# Patient Record
Sex: Female | Born: 1965 | Race: White | Hispanic: No | Marital: Married | Smoking: Current some day smoker
Health system: Southern US, Community
[De-identification: ages and names within clinical notes are randomized; demographics above are authoritative.]

## PROBLEM LIST (undated history)

## (undated) DIAGNOSIS — J45909 Unspecified asthma, uncomplicated: Secondary | ICD-10-CM

## (undated) DIAGNOSIS — F419 Anxiety disorder, unspecified: Secondary | ICD-10-CM

## (undated) HISTORY — DX: Unspecified asthma, uncomplicated: J45.909

## (undated) HISTORY — DX: Anxiety disorder, unspecified: F41.9

## (undated) HISTORY — PX: ABDOMINOPLASTY: SUR9

## (undated) HISTORY — PX: AUGMENTATION MAMMAPLASTY: SUR837

## (undated) HISTORY — PX: PLACEMENT OF BREAST IMPLANTS: SHX6334

## (undated) HISTORY — PX: TUBAL LIGATION: SHX77

---

## 2012-09-01 DIAGNOSIS — F17200 Nicotine dependence, unspecified, uncomplicated: Secondary | ICD-10-CM | POA: Insufficient documentation

## 2019-06-08 LAB — HM MAMMOGRAPHY

## 2021-01-28 ENCOUNTER — Ambulatory Visit (INDEPENDENT_AMBULATORY_CARE_PROVIDER_SITE_OTHER): Payer: PRIVATE HEALTH INSURANCE | Admitting: Family Medicine

## 2021-01-28 ENCOUNTER — Encounter: Payer: Self-pay | Admitting: Family Medicine

## 2021-01-28 ENCOUNTER — Other Ambulatory Visit: Payer: Self-pay

## 2021-01-28 DIAGNOSIS — F17210 Nicotine dependence, cigarettes, uncomplicated: Secondary | ICD-10-CM | POA: Diagnosis not present

## 2021-01-28 DIAGNOSIS — F17200 Nicotine dependence, unspecified, uncomplicated: Secondary | ICD-10-CM | POA: Insufficient documentation

## 2021-01-28 NOTE — Assessment & Plan Note (Signed)
Counseled on smoking cessation.  Pre-contemplative.

## 2021-01-28 NOTE — Progress Notes (Signed)
Dana Khan - 55 y.o. female MRN [id number]  Date of birth: 16-Aug-1965  Subjective Chief Complaint  Patient presents with   Establish Care    HPI Dana Khan is a 55 y.o. female here today for initial visit to establish care.  She has been in pretty good health.  She is a daily smoker.  She isn't ready to quit yet.  She is scheduled for annual exam at tomorrow's visit.   ROS:  A comprehensive ROS was completed and negative except as noted per HPI  No Known Allergies  History reviewed. No pertinent past medical history.  Past Surgical History:  Procedure Laterality Date   ABDOMINOPLASTY     PLACEMENT OF BREAST IMPLANTS     TUBAL LIGATION      Social History   Socioeconomic History   Marital status: Not on file    Spouse name: Not on file   Number of children: Not on file   Years of education: Not on file   Highest education level: Not on file  Occupational History   Not on file  Tobacco Use   Smoking status: Every Day    Packs/day: 0.50    Years: 13.00    Pack years: 6.50    Types: Cigarettes   Smokeless tobacco: Never  Vaping Use   Vaping Use: Never used  Substance and Sexual Activity   Alcohol use: Yes    Alcohol/week: 1.0 - 2.0 standard drink    Types: 1 - 2 Standard drinks or equivalent per week   Drug use: Never   Sexual activity: Yes  Other Topics Concern   Not on file  Social History Narrative   Not on file   Social Determinants of Health   Financial Resource Strain: Not on file  Food Insecurity: Not on file  Transportation Needs: Not on file  Physical Activity: Not on file  Stress: Not on file  Social Connections: Not on file    Family History  Problem Relation Age of Onset   Diabetes Brother    Stroke Maternal Aunt     Health Maintenance  Topic Date Due   COVID-19 Vaccine (1) Never done   Pneumococcal Vaccine 40-91 Years old (1 - PCV) Never done   HIV Screening  Never done   Hepatitis C Screening  Never done   PAP SMEAR-Modifier  Never  done   COLONOSCOPY (Pts 45-54yrs Insurance coverage will need to be confirmed)  Never done   MAMMOGRAM  Never done   Zoster Vaccines- Shingrix (1 of 2) Never done   INFLUENZA VACCINE  03/04/2021   TETANUS/TDAP  12/07/2026   HPV VACCINES  Aged Out     ----------------------------------------------------------------------------------------------------------------------------------------------------------------------------------------------------------------- Physical Exam BP (!) 163/88 (BP Location: Left Arm, Patient Position: Sitting, Cuff Size: Small)   Pulse 92   Temp 98.4 F (36.9 C)   Ht 5' 3.78" (1.62 m)   Wt 123 lb 11.2 oz (56.1 kg)   SpO2 98%   BMI 21.38 kg/m   Physical Exam Constitutional:      Appearance: Normal appearance.  Skin:    General: Skin is warm and dry.  Neurological:     General: No focal deficit present.     Mental Status: She is alert.  Psychiatric:        Mood and Affect: Mood normal.        Behavior: Behavior normal.    ------------------------------------------------------------------------------------------------------------------------------------------------------------------------------------------------------------------- Assessment and Plan  Nicotine dependence Counseled on smoking cessation.  Pre-contemplative.     No  orders of the defined types were placed in this encounter.   No follow-ups on file.    This visit occurred during the SARS-CoV-2 public health emergency.  Safety protocols were in place, including screening questions prior to the visit, additional usage of staff PPE, and extensive cleaning of exam room while observing appropriate contact time as indicated for disinfecting solutions.

## 2021-01-29 ENCOUNTER — Encounter: Payer: Self-pay | Admitting: Family Medicine

## 2021-01-29 ENCOUNTER — Ambulatory Visit (INDEPENDENT_AMBULATORY_CARE_PROVIDER_SITE_OTHER): Payer: PRIVATE HEALTH INSURANCE | Admitting: Family Medicine

## 2021-01-29 VITALS — BP 143/88 | HR 94 | Ht 63.78 in | Wt 123.0 lb

## 2021-01-29 DIAGNOSIS — Z1231 Encounter for screening mammogram for malignant neoplasm of breast: Secondary | ICD-10-CM | POA: Diagnosis not present

## 2021-01-29 DIAGNOSIS — R030 Elevated blood-pressure reading, without diagnosis of hypertension: Secondary | ICD-10-CM | POA: Diagnosis not present

## 2021-01-29 DIAGNOSIS — Z13220 Encounter for screening for lipoid disorders: Secondary | ICD-10-CM

## 2021-01-29 DIAGNOSIS — Z0000 Encounter for general adult medical examination without abnormal findings: Secondary | ICD-10-CM

## 2021-01-29 NOTE — Patient Instructions (Signed)
Preventive Care 68-54 Years Old, Female Preventive care refers to lifestyle choices and visits with your health care provider that can promote health and wellness. This includes: A yearly physical exam. This is also called an annual wellness visit. Regular dental and eye exams. Immunizations. Screening for certain conditions. Healthy lifestyle choices, such as: Eating a healthy diet. Getting regular exercise. Not using drugs or products that contain nicotine and tobacco. Limiting alcohol use. What can I expect for my preventive care visit? Physical exam Your health care provider will check your: Height and weight. These may be used to calculate your BMI (body mass index). BMI is a measurement that tells if you are at a healthy weight. Heart rate and blood pressure. Body temperature. Skin for abnormal spots. Counseling Your health care provider may ask you questions about your: Past medical problems. Family's medical history. Alcohol, tobacco, and drug use. Emotional well-being. Home life and relationship well-being. Sexual activity. Diet, exercise, and sleep habits. Work and work Statistician. Access to firearms. Method of birth control. Menstrual cycle. Pregnancy history. What immunizations do I need?  Vaccines are usually given at various ages, according to a schedule. Your health care provider will recommend vaccines for you based on your age, medicalhistory, and lifestyle or other factors, such as travel or where you work. What tests do I need? Blood tests Lipid and cholesterol levels. These may be checked every 5 years, or more often if you are over 37 years old. Hepatitis C test. Hepatitis B test. Screening Lung cancer screening. You may have this screening every year starting at age 30 if you have a 30-pack-year history of smoking and currently smoke or have quit within the past 15 years. Colorectal cancer screening. All adults should have this screening starting at  age 23 and continuing until age 3. Your health care provider may recommend screening at age 88 if you are at increased risk. You will have tests every 1-10 years, depending on your results and the type of screening test. Diabetes screening. This is done by checking your blood sugar (glucose) after you have not eaten for a while (fasting). You may have this done every 1-3 years. Mammogram. This may be done every 1-2 years. Talk with your health care provider about when you should start having regular mammograms. This may depend on whether you have a family history of breast cancer. BRCA-related cancer screening. This may be done if you have a family history of breast, ovarian, tubal, or peritoneal cancers. Pelvic exam and Pap test. This may be done every 3 years starting at age 79. Starting at age 54, this may be done every 5 years if you have a Pap test in combination with an HPV test. Other tests STD (sexually transmitted disease) testing, if you are at risk. Bone density scan. This is done to screen for osteoporosis. You may have this scan if you are at high risk for osteoporosis. Talk with your health care provider about your test results, treatment options,and if necessary, the need for more tests. Follow these instructions at home: Eating and drinking  Eat a diet that includes fresh fruits and vegetables, whole grains, lean protein, and low-fat dairy products. Take vitamin and mineral supplements as recommended by your health care provider. Do not drink alcohol if: Your health care provider tells you not to drink. You are pregnant, may be pregnant, or are planning to become pregnant. If you drink alcohol: Limit how much you have to 0-1 drink a day. Be aware  of how much alcohol is in your drink. In the [place] one drink equals one 12 oz bottle of beer (355 mL), one 5 oz glass of wine (148 mL), or one 1 oz glass of hard liquor (44 mL).  Lifestyle Take daily care of your teeth and  gums. Brush your teeth every morning and night with fluoride toothpaste. Floss one time each day. Stay active. Exercise for at least 30 minutes 5 or more days each week. Do not use any products that contain nicotine or tobacco, such as cigarettes, e-cigarettes, and chewing tobacco. If you need help quitting, ask your health care provider. Do not use drugs. If you are sexually active, practice safe sex. Use a condom or other form of protection to prevent STIs (sexually transmitted infections). If you do not wish to become pregnant, use a form of birth control. If you plan to become pregnant, see your health care provider for a prepregnancy visit. If told by your health care provider, take low-dose aspirin daily starting at age 29. Find healthy ways to cope with stress, such as: Meditation, yoga, or listening to music. Journaling. Talking to a trusted person. Spending time with friends and family. Safety Always wear your seat belt while driving or riding in a vehicle. Do not drive: If you have been drinking alcohol. Do not ride with someone who has been drinking. When you are tired or distracted. While texting. Wear a helmet and other protective equipment during sports activities. If you have firearms in your house, make sure you follow all gun safety procedures. What's next? Visit your health care provider once a year for an annual wellness visit. Ask your health care provider how often you should have your eyes and teeth checked. Stay up to date on all vaccines. This information is not intended to replace advice given to you by your health care provider. Make sure you discuss any questions you have with your healthcare provider. Document Revised: 04/24/2020 Document Reviewed: 04/01/2018 Elsevier Patient Education  2022 [employer].

## 2021-01-29 NOTE — Progress Notes (Signed)
[name] - 55 y.o. female MRN [id number]  Date of birth: June 14, 1966  Subjective Chief Complaint  Patient presents with   Annual Exam    HPI Dana Khan is a 55 y.o. female here today for annual exam.  She has been in good health.  She is a current smoker.  BP elevated, admits to some stressors related to family.  She has no concerns today.   She reports that she had normal colonoscopy at age 55 with digestive health.  Pap completed in 2020 with [facility]  She is due for mammogram.   She does not want shingles vaccine.  She does not plan to get COVID vaccine  Review of Systems  Constitutional:  Negative for chills, fever, malaise/fatigue and weight loss.  HENT:  Negative for congestion, ear pain and sore throat.   Eyes:  Negative for blurred vision, double vision and pain.  Respiratory:  Negative for cough and shortness of breath.   Cardiovascular:  Negative for chest pain and palpitations.  Gastrointestinal:  Negative for abdominal pain, blood in stool, constipation, heartburn and nausea.  Genitourinary:  Negative for dysuria and urgency.  Musculoskeletal:  Negative for joint pain and myalgias.  Neurological:  Negative for dizziness and headaches.  Endo/Heme/Allergies:  Does not bruise/bleed easily.  Psychiatric/Behavioral:  Negative for depression. The patient is not nervous/anxious and does not have insomnia.     No Known Allergies  History reviewed. No pertinent past medical history.  Past Surgical History:  Procedure Laterality Date   ABDOMINOPLASTY     PLACEMENT OF BREAST IMPLANTS     TUBAL LIGATION      Social History   Socioeconomic History   Marital status: Married    Spouse name: Not on file   Number of children: Not on file   Years of education: Not on file   Highest education level: Not on file  Occupational History   Not on file  Tobacco Use   Smoking status: Every Day    Packs/day: 0.50    Years: 13.00    Pack years: 6.50    Types:  Cigarettes   Smokeless tobacco: Never  Vaping Use   Vaping Use: Never used  Substance and Sexual Activity   Alcohol use: Yes    Alcohol/week: 1.0 - 2.0 standard drink    Types: 1 - 2 Standard drinks or equivalent per week   Drug use: Never   Sexual activity: Yes  Other Topics Concern   Not on file  Social History Narrative   Not on file   Social Determinants of Health   Financial Resource Strain: Not on file  Food Insecurity: Not on file  Transportation Needs: Not on file  Physical Activity: Not on file  Stress: Not on file  Social Connections: Not on file    Family History  Problem Relation Age of Onset   Diabetes Brother    Stroke Maternal Aunt     Health Maintenance  Topic Date Due   COVID-19 Vaccine (1) Never done   Pneumococcal Vaccine 34-80 Years old (1 - PCV) Never done   HIV Screening  Never done   Hepatitis C Screening  Never done   PAP SMEAR-Modifier  Never done   COLONOSCOPY (Pts 45-61yr Insurance coverage will need to be confirmed)  Never done   MAMMOGRAM  Never done   Zoster Vaccines- Shingrix (1 of 2) Never done   INFLUENZA VACCINE  03/04/2021   TETANUS/TDAP  12/07/2026   HPV VACCINES  Aged  Out     ----------------------------------------------------------------------------------------------------------------------------------------------------------------------------------------------------------------- Physical Exam BP (!) 143/88 (BP Location: Left Arm, Patient Position: Sitting, Cuff Size: Small)   Pulse 94   Ht 5' 3.78" (1.62 m)   Wt 123 lb (55.8 kg)   SpO2 96%   BMI 21.26 kg/m   Physical Exam Constitutional:      General: She is not in acute distress. HENT:     Head: Normocephalic and atraumatic.     Right Ear: Tympanic membrane and ear canal normal.     Left Ear: Tympanic membrane and ear canal normal.     Nose: Nose normal.  Eyes:     General: No scleral icterus.    Conjunctiva/sclera: Conjunctivae normal.  Neck:     Thyroid:  No thyromegaly.  Cardiovascular:     Rate and Rhythm: Normal rate and regular rhythm.     Heart sounds: Normal heart sounds.  Pulmonary:     Effort: Pulmonary effort is normal.     Breath sounds: Normal breath sounds.  Abdominal:     General: Bowel sounds are normal. There is no distension.     Palpations: Abdomen is soft.     Tenderness: There is no abdominal tenderness. There is no guarding.  Musculoskeletal:        General: Normal range of motion.     Cervical back: Normal range of motion and neck supple.  Lymphadenopathy:     Cervical: No cervical adenopathy.  Skin:    General: Skin is warm and dry.     Findings: No rash.  Neurological:     General: No focal deficit present.     Mental Status: She is alert and oriented to person, place, and time.     Cranial Nerves: No cranial nerve deficit.     Coordination: Coordination normal.  Psychiatric:        Mood and Affect: Mood normal.        Behavior: Behavior normal.    ------------------------------------------------------------------------------------------------------------------------------------------------------------------------------------------------------------------- Assessment and Plan  Well adult exam Well adult Orders Placed This Encounter  Procedures   MM 3D SCREEN BREAST BILATERAL    Standing Status:   Future    Standing Expiration Date:   01/29/2022    Order Specific Question:   Reason for Exam (SYMPTOM  OR DIAGNOSIS REQUIRED)    Answer:   breast cancer screening    Order Specific Question:   Is the patient pregnant?    Answer:   No    Order Specific Question:   Preferred imaging location?    Answer:   [facility]   Comp Met (CMET)   CBC with Differential   Lipid Panel w/reflex Direct LDL   TSH  Screening:  Lipid panel, mammogram.  Colonoscopy and pap reports requested. Immunizations: Declines recommended immunizations.  Anticipatory guidance/Risk factor reduction:  Counseled on smoking  cessation.  Additional recommendations per AVS   No orders of the defined types were placed in this encounter.   No follow-ups on file.    This visit occurred during the SARS-CoV-2 public health emergency.  Safety protocols were in place, including screening questions prior to the visit, additional usage of staff PPE, and extensive cleaning of exam room while observing appropriate contact time as indicated for disinfecting solutions.

## 2021-01-29 NOTE — Assessment & Plan Note (Signed)
Well adult Orders Placed This Encounter  Procedures  . MM 3D SCREEN BREAST BILATERAL    Standing Status:   Future    Standing Expiration Date:   01/29/2022    Order Specific Question:   Reason for Exam (SYMPTOM  OR DIAGNOSIS REQUIRED)    Answer:   breast cancer screening    Order Specific Question:   Is the patient pregnant?    Answer:   No    Order Specific Question:   Preferred imaging location?    Answer:   [facility]  . Comp Met (CMET)  . CBC with Differential  . Lipid Panel w/reflex Direct LDL  . TSH  Screening:  Lipid panel, mammogram.  Colonoscopy and pap reports requested. Immunizations: Declines recommended immunizations.  Anticipatory guidance/Risk factor reduction:  Counseled on smoking cessation.  Additional recommendations per AVS 

## 2021-01-30 LAB — CBC WITH DIFFERENTIAL/PLATELET
Absolute Monocytes: 460 cells/uL (ref 200–950)
Basophils Absolute: 30 cells/uL (ref 0–200)
Basophils Relative: 0.6 %
Eosinophils Absolute: 50 cells/uL (ref 15–500)
Eosinophils Relative: 1 %
HCT: 45.1 % — ABNORMAL HIGH (ref 35.0–45.0)
Hemoglobin: 15.2 g/dL (ref 11.7–15.5)
Lymphs Abs: 2180 cells/uL (ref 850–3900)
MCH: 32.1 pg (ref 27.0–33.0)
MCHC: 33.7 g/dL (ref 32.0–36.0)
MCV: 95.3 fL (ref 80.0–100.0)
MPV: 10.8 fL (ref 7.5–12.5)
Monocytes Relative: 9.2 %
Neutro Abs: 2280 cells/uL (ref 1500–7800)
Neutrophils Relative %: 45.6 %
Platelets: 231 10*3/uL (ref 140–400)
RBC: 4.73 10*6/uL (ref 3.80–5.10)
RDW: 12.4 % (ref 11.0–15.0)
Total Lymphocyte: 43.6 %
WBC: 5 10*3/uL (ref 3.8–10.8)

## 2021-01-30 LAB — LIPID PANEL W/REFLEX DIRECT LDL
Cholesterol: 213 mg/dL — ABNORMAL HIGH (ref <200)
HDL: 80 mg/dL (ref >=50)
LDL Cholesterol (Calc): 117 mg/dL (calc) — ABNORMAL HIGH
Non-HDL Cholesterol (Calc): 133 mg/dL (calc) — ABNORMAL HIGH (ref <130)
Total CHOL/HDL Ratio: 2.7 (calc) (ref <5.0)
Triglycerides: 64 mg/dL (ref <150)

## 2021-01-30 LAB — COMPREHENSIVE METABOLIC PANEL
AG Ratio: 1.8 (calc) (ref 1.0–2.5)
ALT: 19 U/L (ref 6–29)
AST: 18 U/L (ref 10–35)
Albumin: 4.9 g/dL (ref 3.6–5.1)
Alkaline phosphatase (APISO): 59 U/L (ref 37–153)
BUN: 9 mg/dL (ref 7–25)
CO2: 33 mmol/L — ABNORMAL HIGH (ref 20–32)
Calcium: 10.4 mg/dL (ref 8.6–10.4)
Chloride: 97 mmol/L — ABNORMAL LOW (ref 98–110)
Creat: 0.6 mg/dL (ref 0.50–1.05)
Globulin: 2.8 g/dL (calc) (ref 1.9–3.7)
Glucose, Bld: 90 mg/dL (ref 65–99)
Potassium: 4.2 mmol/L (ref 3.5–5.3)
Sodium: 138 mmol/L (ref 135–146)
Total Bilirubin: 0.3 mg/dL (ref 0.2–1.2)
Total Protein: 7.7 g/dL (ref 6.1–8.1)

## 2021-01-30 LAB — TSH: TSH: 2.11 mIU/L

## 2021-02-13 ENCOUNTER — Other Ambulatory Visit: Payer: Self-pay | Admitting: Family Medicine

## 2021-02-13 ENCOUNTER — Ambulatory Visit (INDEPENDENT_AMBULATORY_CARE_PROVIDER_SITE_OTHER): Payer: PRIVATE HEALTH INSURANCE

## 2021-02-13 ENCOUNTER — Other Ambulatory Visit: Payer: Self-pay

## 2021-02-13 DIAGNOSIS — Z1231 Encounter for screening mammogram for malignant neoplasm of breast: Secondary | ICD-10-CM

## 2021-05-17 ENCOUNTER — Emergency Department (INDEPENDENT_AMBULATORY_CARE_PROVIDER_SITE_OTHER): Payer: PRIVATE HEALTH INSURANCE

## 2021-05-17 ENCOUNTER — Emergency Department
Admission: EM | Admit: 2021-05-17 | Discharge: 2021-05-17 | Disposition: A | Discharge status: Home / Self Care | Payer: PRIVATE HEALTH INSURANCE | Source: Home / Self Care | Attending: Family Medicine | Admitting: Family Medicine

## 2021-05-17 ENCOUNTER — Other Ambulatory Visit: Payer: Self-pay

## 2021-05-17 DIAGNOSIS — R0789 Other chest pain: Secondary | ICD-10-CM | POA: Diagnosis not present

## 2021-05-17 DIAGNOSIS — R079 Chest pain, unspecified: Secondary | ICD-10-CM | POA: Diagnosis not present

## 2021-05-17 NOTE — ED Provider Notes (Signed)
[facility]    CSN: [id number] Arrival date & time: 05/17/21  1451      History   Chief Complaint Chief Complaint  Patient presents with   Shoulder Injury   Chest Pain    HPI Dana Khan is a 55 y.o. female.   Patient states that she has been having intermittent vague left shoulder pain for about 2 weeks.  She recalls no injury or change in activities.  She states that she has had an intermittent non-productive cough for about two months.  She continues to smoke.  During the past week she has had vague intermittent recurring left and mid-chest non-radiating pain.  The pain occurs at rest and not with activity.  She denies shortness of breath, pleuritic pain, and fevers, chills, and sweats.  She denies GI symptoms.  She notes that her chest and shoulder symptoms do not seem to be related. Family history of heart failure in her mother.  The history is provided by the patient.  Chest Pain Pain location:  L chest and substernal area Pain quality: aching   Pain radiates to:  Does not radiate Pain severity:  Mild Onset quality:  Sudden Duration:  1 week Timing:  Sporadic Progression:  Unchanged Chronicity:  New Context: at rest   Context: not breathing, not eating, not lifting, not movement, not raising an arm, not stress and not trauma   Relieved by:  Nothing Worsened by:  Nothing Ineffective treatments:  None tried Associated symptoms: cough and headache   Associated symptoms: no abdominal pain, no AICD problem, no anorexia, no anxiety, no back pain, no claudication, no diaphoresis, no dizziness, no dysphagia, no fatigue, no fever, no heartburn, no lower extremity edema, no nausea, no palpitations, no PND, no shortness of breath, no syncope, no vomiting and no weakness   Risk factors: smoking   Shoulder Pain Location:  Shoulder Shoulder location:  L shoulder Injury: no   Pain details:    Quality:  Aching   Radiates to:  Does not radiate   Severity:  Mild    Onset quality:  Sudden   Duration:  2 weeks   Timing:  Sporadic   Progression:  Unchanged Handedness:  Right-handed Prior injury to area:  No Relieved by:  None tried Worsened by:  Nothing Ineffective treatments:  None tried Associated symptoms: no back pain, no decreased range of motion, no fatigue, no fever, no neck pain, no stiffness and no swelling   Risk factors: no recent illness    History reviewed. No pertinent past medical history.  Patient Active Problem List   Diagnosis Date Noted   Well adult exam 01/29/2021   Nicotine dependence 01/28/2021    Past Surgical History:  Procedure Laterality Date   ABDOMINOPLASTY     AUGMENTATION MAMMAPLASTY Bilateral    PLACEMENT OF BREAST IMPLANTS     TUBAL LIGATION      OB History   No obstetric history on file.      Home Medications    Prior to Admission medications   Not on File    Family History Family History  Problem Relation Age of Onset   Heart failure Mother    Diabetes Brother    Stroke Maternal Aunt     Social History Social History   Tobacco Use   Smoking status: Every Day    Packs/day: 0.50    Years: 13.00    Pack years: 6.50    Types: Cigarettes   Smokeless tobacco: Never  Vaping  Use   Vaping Use: Never used  Substance Use Topics   Alcohol use: Yes    Alcohol/week: 1.0 - 2.0 standard drink    Types: 1 - 2 Standard drinks or equivalent per week   Drug use: Never     Allergies   Patient has no known allergies.   Review of Systems Review of Systems  Constitutional:  Negative for activity change, appetite change, chills, diaphoresis, fatigue, fever and unexpected weight change.  HENT: Negative.  Negative for trouble swallowing.   Eyes: Negative.   Respiratory:  Positive for cough. Negative for chest tightness, shortness of breath, wheezing and stridor.   Cardiovascular:  Positive for chest pain. Negative for palpitations, claudication, leg swelling, syncope and PND.  Gastrointestinal:   Negative for abdominal pain, anorexia, heartburn, nausea and vomiting.  Genitourinary: Negative.   Musculoskeletal:  Negative for back pain, neck pain and stiffness.  Skin:  Negative for rash.  Neurological:  Positive for headaches. Negative for dizziness and weakness.    Physical Exam Triage Vital Signs ED Triage Vitals  Enc Vitals Group     BP 05/17/21 1513 124/87     Pulse Rate 05/17/21 1513 93     Resp 05/17/21 1513 15     Temp 05/17/21 1513 98.2 F (36.8 C)     Temp Source 05/17/21 1513 Oral     SpO2 05/17/21 1513 95 %     Weight 05/17/21 1509 122 lb (55.3 kg)     Height 05/17/21 1509 5\' 4"  (1.626 m)     Head Circumference --      Peak Flow --      Pain Score 05/17/21 1508 4     Pain Loc --      Pain Edu? --      Excl. in GC? --    No data found.  Updated Vital Signs BP 124/87 (BP Location: Right Arm)   Pulse 93   Temp 98.2 F (36.8 C) (Oral)   Resp 15   Ht 5\' 4"  (1.626 m)   Wt 55.3 kg   SpO2 95%   BMI 20.94 kg/m   Visual Acuity Right Eye Distance:   Left Eye Distance:   Bilateral Distance:    Right Eye Near:   Left Eye Near:    Bilateral Near:     Physical Exam Vitals and nursing note reviewed.  Constitutional:      General: She is not in acute distress. HENT:     Head: Normocephalic.     Nose: Nose normal.     Mouth/Throat:     Pharynx: Oropharynx is clear.  Eyes:     Conjunctiva/sclera: Conjunctivae normal.     Pupils: Pupils are equal, round, and reactive to light.     Comments: Fundi benign  Cardiovascular:     Rate and Rhythm: Normal rate and regular rhythm.     Heart sounds: Normal heart sounds.  Pulmonary:     Breath sounds: Normal breath sounds.  Chest:     Chest wall: No tenderness.  Abdominal:     Palpations: Abdomen is soft.     Tenderness: There is no abdominal tenderness.  Musculoskeletal:        General: No swelling or tenderness.     Left shoulder: Normal. No tenderness or bony tenderness. Normal range of motion. Normal  pulse.     Cervical back: Neck supple.     Right lower leg: No edema.     Left lower leg: No edema.  Lymphadenopathy:     Cervical: No cervical adenopathy.  Skin:    General: Skin is warm and dry.     Findings: No rash.  Neurological:     General: No focal deficit present.     Mental Status: She is alert.     UC Treatments / Results  Labs (all labs ordered are listed, but only abnormal results are displayed) Labs Reviewed - No data to display  EKG   Radiology DG Chest 2 View  Result Date: 05/17/2021 CLINICAL DATA:  Left anterosuperior intermittent chest pain for 1 week EXAM: CHEST - 2 VIEW COMPARISON:  None. FINDINGS: The heart and mediastinal contours are within normal limits. Mild atherosclerotic plaque. No focal consolidation. No pulmonary edema. No pleural effusion. No pneumothorax. No acute osseous abnormality. IMPRESSION: 1. No active cardiopulmonary disease. 2.  Aortic Atherosclerosis (ICD10-I70.0). Electronically Signed   By: Tish Frederickson M.D.   On: 05/17/2021 16:53    Procedures Procedures (including critical care time)  Medications Ordered in UC Medications - No data to display  Initial Impression / Assessment and Plan / UC Course  I have reviewed the triage vital signs and the nursing notes.  Pertinent labs & imaging results that were available during my care of the patient were reviewed by me and considered in my medical decision making (see chart for details).    Patient assymptomatic at present and exam benign.  Chest x-ray shows no pulmonary findings in a smoker.  Note chest x-ray findings of mild atherosclerotic plaque.  ?atypical angina. Followup with Family Doctor as soon as possible fur further evaluation.  Final Clinical Impressions(s) / UC Diagnoses   Final diagnoses:  Atypical chest pain     Discharge Instructions      If symptoms become significantly worse during the night or over the weekend, proceed to the local emergency room.     ED Prescriptions   None       Lattie Haw, MD 05/18/21 1102

## 2021-05-17 NOTE — ED Triage Notes (Signed)
Pt st she has been having L shoulder pain for a while but recently started having chest pain and dull headache with blurred vision.

## 2021-05-17 NOTE — Discharge Instructions (Signed)
If symptoms become significantly worse during the night or over the weekend, proceed to the local emergency room.  

## 2021-05-20 ENCOUNTER — Ambulatory Visit (INDEPENDENT_AMBULATORY_CARE_PROVIDER_SITE_OTHER): Payer: PRIVATE HEALTH INSURANCE | Admitting: Family Medicine

## 2021-05-20 ENCOUNTER — Encounter: Payer: Self-pay | Admitting: Family Medicine

## 2021-05-20 VITALS — BP 120/75 | HR 77 | Ht 64.0 in | Wt 123.0 lb

## 2021-05-20 DIAGNOSIS — M25512 Pain in left shoulder: Secondary | ICD-10-CM | POA: Diagnosis not present

## 2021-05-20 DIAGNOSIS — I700 Atherosclerosis of aorta: Secondary | ICD-10-CM

## 2021-05-20 DIAGNOSIS — R0789 Other chest pain: Secondary | ICD-10-CM

## 2021-05-20 NOTE — Progress Notes (Signed)
Established Patient Office Visit  Subjective:  Patient ID: Dana Khan, female    DOB: 1965/10/03  Age: 55 y.o. MRN: [id number]  CC:  Chief Complaint  Patient presents with   Follow-up    Chest pains    HPI [facility] presents for Chest pain and SOB intermittent. + smoker.  She says she is actually been having left-sided shoulder pain mostly over the outer shoulder for several months but starting a couple of weeks ago she started noticing some left-sided chest pain.  She says when it happens it usually last a couple minutes and then usually ease off.  She just describes it as a "pain".  She occasionally feels short of breath but is not necessarily associated with when she is having the chest pain.  She has not really exercise recently because she got custody of her grandkids and has been taking care of those and has not been able to work out like she used to so she is not sure if the chest pain gets worse or occurs more often with exercise.  She is a smoker.  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  She did have a chest x-ray in the urgent care on October 14 which showed aortic atherosclerosis but otherwise negative chest x-ray.  He reports that she has been more fatigued for several months. Hx of panic attacks. Mild hyperlipidemia.    Fam Hx: Dana Khan with CHF.    History reviewed. No pertinent past medical history.  Past Surgical History:  Procedure Laterality Date   ABDOMINOPLASTY     AUGMENTATION MAMMAPLASTY Bilateral    PLACEMENT OF BREAST IMPLANTS     TUBAL LIGATION      Family History  Problem Relation Age of Onset   Heart failure Mother    Diabetes Brother    Stroke Maternal Aunt     Social History   Socioeconomic History   Marital status: Married    Spouse name: Not on file   Number of children: Not on file   Years of education: Not on file   Highest education level: Not on file  Occupational History   Not on file  Tobacco Use   Smoking status: Every Day    Packs/day: 0.50     Years: 13.00    Pack years: 6.50    Types: Cigarettes   Smokeless tobacco: Never  Vaping Use   Vaping Use: Never used  Substance and Sexual Activity   Alcohol use: Yes    Alcohol/week: 1.0 - 2.0 standard drink    Types: 1 - 2 Standard drinks or equivalent per week   Drug use: Never   Sexual activity: Yes  Other Topics Concern   Not on file  Social History Narrative   Not on file   Social Determinants of Health   Financial Resource Strain: Not on file  Food Insecurity: Not on file  Transportation Needs: Not on file  Physical Activity: Not on file  Stress: Not on file  Social Connections: Not on file  Intimate Partner Violence: Not on file    No outpatient medications prior to visit.   No facility-administered medications prior to visit.    No Known Allergies  ROS Review of Systems    Objective:    Physical Exam Constitutional:      Appearance: Normal appearance. She is well-developed.  HENT:     Head: Normocephalic and atraumatic.  Cardiovascular:     Rate and Rhythm: Normal rate and regular rhythm.  Heart sounds: Normal heart sounds.  Pulmonary:     Effort: Pulmonary effort is normal.     Comments: Diffuse coarse breath sounds. Musculoskeletal:     Comments: Left shoulder with normal range of motion and strength is 5 out of 5.  Negative empty can test.  Normal internal and external rotation.  Nontender joint.  Skin:    General: Skin is warm and dry.  Neurological:     Mental Status: She is alert and oriented to person, place, and time.  Psychiatric:        Behavior: Behavior normal.    BP 120/75   Pulse 77   Ht 5\' 4"  (1.626 m)   Wt 123 lb (55.8 kg)   SpO2 98%   BMI 21.11 kg/m  Wt Readings from Last 3 Encounters:  05/20/21 123 lb (55.8 kg)  05/17/21 122 lb (55.3 kg)  01/29/21 123 lb (55.8 kg)     There are no preventive care reminders to display for this patient.   There are no preventive care reminders to display for this  patient.  Lab Results  Component Value Date   TSH 2.11 01/29/2021   Lab Results  Component Value Date   WBC 5.0 01/29/2021   HGB 15.2 01/29/2021   HCT 45.1 (H) 01/29/2021   MCV 95.3 01/29/2021   PLT 231 01/29/2021   Lab Results  Component Value Date   NA 138 01/29/2021   K 4.2 01/29/2021   CO2 33 (H) 01/29/2021   GLUCOSE 90 01/29/2021   BUN 9 01/29/2021   CREATININE 0.60 01/29/2021   BILITOT 0.3 01/29/2021   AST 18 01/29/2021   ALT 19 01/29/2021   PROT 7.7 01/29/2021   CALCIUM 10.4 01/29/2021   Lab Results  Component Value Date   CHOL 213 (H) 01/29/2021   Lab Results  Component Value Date   HDL 80 01/29/2021   Lab Results  Component Value Date   LDLCALC 117 (H) 01/29/2021   Lab Results  Component Value Date   TRIG 64 01/29/2021   Lab Results  Component Value Date   CHOLHDL 2.7 01/29/2021   No results found for: HGBA1C    Assessment & Plan:   Problem List Items Addressed This Visit       Cardiovascular and Mediastinum   Aortic atherosclerosis (HCC)   Relevant Orders   Exercise Tolerance Test   Cardiac Stress Test: Informed Consent Details: Physician/Practitioner Attestation; Transcribe to consent form and obtain patient signature   Other Visit Diagnoses     Atypical chest pain    -  Primary   Relevant Orders   EKG 12-Lead   Exercise Tolerance Test   Cardiac Stress Test: Informed Consent Details: Physician/Practitioner Attestation; Transcribe to consent form and obtain patient signature   Acute pain of left shoulder         Atypical chest pain with mild hyperlipidemia, smoking history, and Dana Khan with a history of MI at age 28.  Noted aortic atherosclerosis on chest x-ray.  I think we should do further work-up with a treadmill stress test she does feel like she could walk without difficulty.  Encouraged smoking cessation of course. EKG today shows rate of 77 bpm, normal sinus rhythm with no acute ST-T wave changes.  We also discussed that stress  could certainly be a component.  She has had a lot going on with taking on her 2 grandkids who are 4 and a.  And has not really had a lot of time for  herself.  She has been staying up late to get things done and so has not really been getting enough rest.  We will more than happy to discuss stress further and help her cope, but some ways to get into a new regimen with work and taking care of her grandkids  Left shoulder pain-suspect bursitis she has great range of motion and strength and no evidence of rotator cuff injury.  Given exercises to do on her own at home over the next several weeks if not improving then please let me know.  No orders of the defined types were placed in this encounter.   Follow-up: No follow-ups on file.    Nani Gasser, MD

## 2021-05-21 ENCOUNTER — Ambulatory Visit (INDEPENDENT_AMBULATORY_CARE_PROVIDER_SITE_OTHER): Payer: PRIVATE HEALTH INSURANCE

## 2021-05-21 ENCOUNTER — Ambulatory Visit: Payer: PRIVATE HEALTH INSURANCE | Admitting: Family Medicine

## 2021-05-21 ENCOUNTER — Other Ambulatory Visit: Payer: Self-pay

## 2021-05-21 DIAGNOSIS — I700 Atherosclerosis of aorta: Secondary | ICD-10-CM

## 2021-05-21 DIAGNOSIS — R0789 Other chest pain: Secondary | ICD-10-CM

## 2021-05-22 LAB — EXERCISE TOLERANCE TEST
Angina Index: 0
Estimated workload: 11.7
Exercise duration (min): 10 min
Exercise duration (sec): 0 s
MPHR: 166 {beats}/min
Peak HR: 142 {beats}/min
Percent HR: 85 %
RPE: 17
Rest HR: 94 {beats}/min

## 2021-05-23 NOTE — Progress Notes (Signed)
Hi Dana Khan, great news!  You did have a normal stress test.  Your blood pressure did go up during exercise but there were no concerning findings for ischemia.  This is great news.  I would encourage you to embark upon a regular exercise routine if you are not already doing so to really work on building up stamina and endurance.

## 2021-09-09 ENCOUNTER — Encounter: Payer: Self-pay | Admitting: Physician Assistant

## 2021-09-09 ENCOUNTER — Ambulatory Visit (INDEPENDENT_AMBULATORY_CARE_PROVIDER_SITE_OTHER): Payer: PRIVATE HEALTH INSURANCE | Admitting: Physician Assistant

## 2021-09-09 ENCOUNTER — Other Ambulatory Visit: Payer: Self-pay

## 2021-09-09 VITALS — BP 145/96 | HR 84 | Temp 98.4°F | Ht 64.0 in | Wt 125.0 lb

## 2021-09-09 DIAGNOSIS — F17200 Nicotine dependence, unspecified, uncomplicated: Secondary | ICD-10-CM

## 2021-09-09 DIAGNOSIS — J069 Acute upper respiratory infection, unspecified: Secondary | ICD-10-CM | POA: Diagnosis not present

## 2021-09-09 DIAGNOSIS — H1013 Acute atopic conjunctivitis, bilateral: Secondary | ICD-10-CM | POA: Insufficient documentation

## 2021-09-09 DIAGNOSIS — J029 Acute pharyngitis, unspecified: Secondary | ICD-10-CM

## 2021-09-09 LAB — POCT RAPID STREP A (OFFICE): Rapid Strep A Screen: NEGATIVE

## 2021-09-09 MED ORDER — AZELASTINE HCL 0.05 % OP SOLN: 2.0000 [drp] | Freq: Two times a day (BID) | OPHTHALMIC | 11 refills | Status: DC

## 2021-09-09 NOTE — Patient Instructions (Addendum)
Nasal saline washes Tylenol cold sinus severe  Upper Respiratory Infection, Adult An upper respiratory infection (URI) affects the nose, throat, and upper airways that lead to the lungs. The most common type of URI is often called the common cold. URIs usually get better on their own, without medical treatment. What are the causes? A URI is caused by a germ (virus). You may catch these germs by: Breathing in droplets from an infected person's cough or sneeze. Touching something that has the germ on it (is contaminated) and then touching your mouth, nose, or eyes. What increases the risk? You are more likely to get a URI if: You are very young or very old. You have close contact with others, such as at work, school, or a health care facility. You smoke. You have long-term (chronic) heart or lung disease. You have a weakened disease-fighting system (immune system). You have nasal allergies or asthma. You have a lot of stress. You have poor nutrition. What are the signs or symptoms? Runny or stuffy (congested) nose. Cough. Sneezing. Sore throat. Headache. Feeling tired (fatigue). Fever. Not wanting to eat as much as usual. Pain in your forehead, behind your eyes, and over your cheekbones (sinus pain). Muscle aches. Redness or irritation of the eyes. Pressure in the ears or face. How is this treated? URIs usually get better on their own within 7-10 days. Medicines cannot cure URIs, but your doctor may recommend certain medicines to help relieve symptoms, such as: Over-the-counter cold medicines. Medicines to reduce coughing (cough suppressants). Coughing is a type of defense against infection that helps to clear the nose, throat, windpipe, and lungs (respiratory system). Take these medicines only as told by your doctor. Medicines to lower your fever. Follow these instructions at home: Activity Rest as needed. If you have a fever, stay home from work or school until your fever is  gone, or until your doctor says you may return to work or school. You should stay home until you cannot spread the infection anymore (you are not contagious). Your doctor may have you wear a face mask so you have less risk of spreading the infection. Relieving symptoms Rinse your mouth often with salt water. To make salt water, dissolve -1 tsp (3-6 g) of salt in 1 cup (237 mL) of warm water. Use a cool-mist humidifier to add moisture to the air. This can help you breathe more easily. Eating and drinking  Drink enough fluid to keep your pee (urine) pale yellow. Eat soups and other clear broths. General instructions  Take over-the-counter and prescription medicines only as told by your doctor. Do not smoke or use any products that contain nicotine or tobacco. If you need help quitting, ask your doctor. Avoid being where people are smoking (avoid secondhand smoke). Stay up to date on all your shots (immunizations), and get the flu shot every year. Keep all follow-up visits. How to prevent the spread of infection to others  Wash your hands with soap and water for at least 20 seconds. If you cannot use soap and water, use hand sanitizer. Avoid touching your mouth, face, eyes, or nose. Cough or sneeze into a tissue or your sleeve or elbow. Do not cough or sneeze into your hand or into the air. Contact a doctor if: You are getting worse, not better. You have any of these: A fever or chills. Brown or red mucus in your nose. Yellow or brown fluid (discharge)coming from your nose. Pain in your face, especially when you bend  forward. Swollen neck glands. Pain when you swallow. White areas in the back of your throat. Get help right away if: You have shortness of breath that gets worse. You have very bad or constant: Headache. Ear pain. Pain in your forehead, behind your eyes, and over your cheekbones (sinus pain). Chest pain. You have long-lasting (chronic) lung disease along with any of  these: Making high-pitched whistling sounds when you breathe, most often when you breathe out (wheezing). Long-lasting cough (more than 14 days). Coughing up blood. A change in your usual mucus. You have a stiff neck. You have changes in your: Vision. Hearing. Thinking. Mood. These symptoms may be an emergency. Get help right away. Call 911. Do not wait to see if the symptoms will go away. Do not drive yourself to the hospital. Summary An upper respiratory infection (URI) is caused by a germ (virus). The most common type of URI is often called the common cold. URIs usually get better within 7-10 days. Take over-the-counter and prescription medicines only as told by your doctor. This information is not intended to replace advice given to you by your health care provider. Make sure you discuss any questions you have with your health care provider. Document Revised: 02/20/2021 Document Reviewed: 02/20/2021 Elsevier Patient Education  2022 [employer].

## 2021-09-09 NOTE — Progress Notes (Signed)
° °  Subjective:    Patient ID: Mariann Laster, female    DOB: 1966-07-24, 56 y.o.   MRN: [id number]  HPI Pt is a 56 yo female with ST, sinus pressure, fatigue since Saturday. Her grandchildren have strep throat and she wants to make sure she does not. She denies any fever, chills, body aches, cough, SOB. She is a smoker so has some ongoing cough and sinus pressure most days. She is blowing out clear discharge. She is very tired. She is using nyquil which helps some at night. She does not have covid vaccine.   .. Active Ambulatory Problems    Diagnosis Date Noted   Nicotine dependence 01/28/2021   Well adult exam 01/29/2021   Aortic atherosclerosis (HCC) 05/20/2021   Allergic conjunctivitis of both eyes 09/09/2021   Resolved Ambulatory Problems    Diagnosis Date Noted   No Resolved Ambulatory Problems   No Additional Past Medical History       Review of Systems See HPI.     Objective:   Physical Exam Vitals reviewed.  Constitutional:      Appearance: She is well-developed.  HENT:     Head: Normocephalic.     Right Ear: Tympanic membrane and ear canal normal. No tenderness. No middle ear effusion. Tympanic membrane is not erythematous.     Left Ear: Tympanic membrane and ear canal normal. No tenderness.  No middle ear effusion. Tympanic membrane is not erythematous.     Nose: Congestion present.     Mouth/Throat:     Mouth: Mucous membranes are moist.     Pharynx: Posterior oropharyngeal erythema present. No oropharyngeal exudate or uvula swelling.     Tonsils: No tonsillar exudate or tonsillar abscesses.  Eyes:     Pupils: Pupils are equal, round, and reactive to light.     Comments: Bilateral conjunctiva injection with watery discharge.   Cardiovascular:     Rate and Rhythm: Normal rate and regular rhythm.     Heart sounds: Normal heart sounds.  Pulmonary:     Effort: Pulmonary effort is normal.     Comments: Diminished lung sounds Lymphadenopathy:     Cervical: No  cervical adenopathy.  Neurological:     Mental Status: She is alert.  Psychiatric:        Mood and Affect: Mood normal.    .. Results for orders placed or performed in visit on 09/09/21  POCT rapid strep A  Result Value Ref Range   Rapid Strep A Screen Negative Negative         Assessment & Plan:  Marland KitchenMarland KitchenKeyairra was seen today for sore throat.  Diagnoses and all orders for this visit:  Viral upper respiratory tract infection  Sore throat -     POCT rapid strep A  Current smoker  Allergic conjunctivitis of both eyes -     azelastine (OPTIVAR) 0.05 % ophthalmic solution; Place 2 drops into both eyes 2 (two) times daily.   Negative strep.  Discussed likely viral.  Discussed symptomatic care with tylenol cold sinus severe, nasal saline rinses, flonase.  Optivar for more allergic ongoing itchy eyes.  If symptoms worsening consider abx before the weekend.  Discussed smoking and ongoing inflammation. Consider follow up with PCP to discuss.

## 2021-09-09 NOTE — Progress Notes (Signed)
nne

## 2021-10-05 ENCOUNTER — Emergency Department: Admission: EM | Admit: 2021-10-05 | Discharge: 2021-10-05 | Discharge status: Absent without leave | Payer: Self-pay

## 2021-10-05 ENCOUNTER — Encounter: Payer: Self-pay | Admitting: Emergency Medicine

## 2021-10-05 ENCOUNTER — Emergency Department (INDEPENDENT_AMBULATORY_CARE_PROVIDER_SITE_OTHER)
Admission: EM | Admit: 2021-10-05 | Discharge: 2021-10-05 | Disposition: A | Discharge status: Home / Self Care | Payer: PRIVATE HEALTH INSURANCE | Source: Home / Self Care | Attending: Family Medicine | Admitting: Family Medicine

## 2021-10-05 ENCOUNTER — Other Ambulatory Visit: Payer: Self-pay

## 2021-10-05 DIAGNOSIS — J209 Acute bronchitis, unspecified: Secondary | ICD-10-CM

## 2021-10-05 MED ORDER — CLARITHROMYCIN 250 MG PO TABS: 250.0000 mg | ORAL_TABLET | Freq: Two times a day (BID) | ORAL | 0 refills | Status: DC

## 2021-10-05 MED ORDER — HYDROCOD POLI-CHLORPHE POLI ER 10-8 MG/5ML PO SUER: 5.0000 mL | Freq: Every evening | ORAL | 0 refills | Status: DC | PRN

## 2021-10-05 MED ORDER — ALBUTEROL SULFATE HFA 108 (90 BASE) MCG/ACT IN AERS: 1.0000 | INHALATION_SPRAY | Freq: Four times a day (QID) | RESPIRATORY_TRACT | 0 refills | Status: DC | PRN

## 2021-10-05 MED ORDER — PREDNISONE 20 MG PO TABS: 20.0000 mg | ORAL_TABLET | Freq: Two times a day (BID) | ORAL | 0 refills | Status: DC

## 2021-10-05 NOTE — ED Triage Notes (Signed)
Cough & congestion on & off for months  ?OTC zyrtec, Claritin D, cough medicine  ?Cough is waking pt up at night  ?Denies fever ?No COVID vaccine  ?

## 2021-10-05 NOTE — Discharge Instructions (Signed)
Drink lots of water ?Run a humidifier if you have 1 ?Take the antibiotic 2 times a day for 10 days ?Take the prednisone 2 times a day for 5 days ?I have given you a stronger cough medicine to use at bedtime.  During the day take a dextromethorphan product over-the-counter ?I have refilled your albuterol inhaler in case it is needed ?See your doctor if not improving over the next week or 2 ?

## 2021-10-05 NOTE — ED Provider Notes (Signed)
?[place] ? ? ? ?CSN: [id number] ?Arrival date & time: 10/05/21  1223 ? ? ?  ? ?History   ?Chief Complaint ?Chief Complaint  ?Patient presents with  ? Cough  ? ? ?HPI ?Amythest Orama is a 56 y.o. female.  ? ?HPI ? ?Patient has had a cough and cold symptoms off and on for months.  She states her current symptoms of been present for at least 3 to 4 weeks.  She has sinus postnasal drip.  Sinus drainage.  Pressure and pain and headaches.  Green sinus discharge.  Green sputum.  Cough.  Shortness of breath.  Chest pain with cough and deep breath.  Fatigue.  States that the cough is keeping her awake at night ? ?History reviewed. No pertinent past medical history. ? ?Patient Active Problem List  ? Diagnosis Date Noted  ? Allergic conjunctivitis of both eyes 09/09/2021  ? Aortic atherosclerosis ([place]) 05/20/2021  ? Well adult exam 01/29/2021  ? Nicotine dependence 01/28/2021  ? ? ?Past Surgical History:  ?Procedure Laterality Date  ? ABDOMINOPLASTY    ? AUGMENTATION MAMMAPLASTY Bilateral   ? PLACEMENT OF BREAST IMPLANTS    ? TUBAL LIGATION    ? ? ?OB History   ?No obstetric history on file. ?  ? ? ? ?Home Medications   ? ?Prior to Admission medications   ?Medication Sig Start Date End Date Taking? Authorizing Provider  ?albuterol (VENTOLIN HFA) 108 (90 Base) MCG/ACT inhaler Inhale 1-2 puffs into the lungs every 6 (six) hours as needed for wheezing or shortness of breath. 10/05/21  Yes Raylene Everts, MD  ?chlorpheniramine-HYDROcodone [facility]) 10-8 MG/5ML Take 5 mLs by mouth at bedtime as needed for cough. 10/05/21  Yes Raylene Everts, MD  ?clarithromycin (BIAXIN) 250 MG tablet Take 1 tablet (250 mg total) by mouth 2 (two) times daily. 10/05/21  Yes Raylene Everts, MD  ?predniSONE (DELTASONE) 20 MG tablet Take 1 tablet (20 mg total) by mouth 2 (two) times daily with a meal. 10/05/21  Yes Raylene Everts, MD  ?azelastine (OPTIVAR) 0.05 % ophthalmic solution Place 2 drops into both eyes 2 (two)  times daily. 09/09/21   Breeback, Royetta Car, PA-C  ?diazepam (VALIUM) 5 MG tablet Take 5 mg by mouth 2 (two) times daily. 06/16/21   [provider]  ? ? ?Family History ?Family History  ?Problem Relation Age of Onset  ? Heart failure Mother   ? Diabetes Brother   ? Stroke Maternal Aunt   ? ? ?Social History ?Social History  ? ?Tobacco Use  ? Smoking status: Every Day  ?  Packs/day: 0.40  ?  Years: 13.00  ?  Pack years: 5.20  ?  Types: Cigarettes  ? Smokeless tobacco: Never  ?Vaping Use  ? Vaping Use: Never used  ?Substance Use Topics  ? Alcohol use: Yes  ?  Alcohol/week: 1.0 - 2.0 standard drink  ?  Types: 1 - 2 Standard drinks or equivalent per week  ? Drug use: Never  ? ? ? ?Allergies   ?Patient has no known allergies. ? ? ?Review of Systems ?Review of Systems ?See HPI ? ?Physical Exam ?Triage Vital Signs ?ED Triage Vitals  ?Enc Vitals Group  ?   BP 10/05/21 1242 129/87  ?   Pulse Rate 10/05/21 1242 90  ?   Resp 10/05/21 1242 18  ?   Temp 10/05/21 1242 99 ?F (37.2 ?C)  ?   Temp Source 10/05/21 1242 Oral  ?  SpO2 10/05/21 1242 96 %  ?   Weight 10/05/21 1243 122 lb (55.3 kg)  ?   Height 10/05/21 1243 5\' 4"  (1.626 m)  ?   Head Circumference --   ?   Peak Flow --   ?   Pain Score 10/05/21 1243 3  ?   Pain Loc --   ?   Pain Edu? --   ?   Excl. in [place]? --   ? ?No data found. ? ?Updated Vital Signs ?BP 129/87 (BP Location: Right Arm)   Pulse 90   Temp 99 ?F (37.2 ?C) (Oral)   Resp 18   Ht 5\' 4"  (1.626 m)   Wt 55.3 kg   SpO2 96%   BMI 20.94 kg/m?  ?   ? ?Physical Exam ?Constitutional:   ?   General: She is not in acute distress. ?   Appearance: She is well-developed. She is ill-appearing.  ?   Comments: Voice is hoarse  ?HENT:  ?   Head: Normocephalic and atraumatic.  ?   Right Ear: Tympanic membrane and ear canal normal.  ?   Left Ear: Tympanic membrane and ear canal normal.  ?   Nose: Congestion and rhinorrhea present.  ?   Mouth/Throat:  ?   Pharynx: Posterior oropharyngeal erythema present.  ?Eyes:  ?    Conjunctiva/sclera: Conjunctivae normal.  ?   Pupils: Pupils are equal, round, and reactive to light.  ?Cardiovascular:  ?   Rate and Rhythm: Normal rate and regular rhythm.  ?   Heart sounds: Normal heart sounds.  ?Pulmonary:  ?   Effort: Pulmonary effort is normal. No respiratory distress.  ?   Comments: Breath sounds decreased.  Scattered wheeze.  No rales ?Abdominal:  ?   General: There is no distension.  ?   Palpations: Abdomen is soft.  ?Musculoskeletal:     ?   General: Normal range of motion.  ?   Cervical back: Normal range of motion.  ?Lymphadenopathy:  ?   Cervical: No cervical adenopathy.  ?Skin: ?   General: Skin is warm and dry.  ?Neurological:  ?   Mental Status: She is alert.  ?Psychiatric:     ?   Mood and Affect: Mood normal.     ?   Behavior: Behavior normal.  ? ? ? ?UC Treatments / Results  ?Labs ?(all labs ordered are listed, but only abnormal results are displayed) ?Labs Reviewed - No data to display ? ?EKG ? ? ?Radiology ?No results found. ? ?Procedures ?Procedures (including critical care time) ? ?Medications Ordered in UC ?Medications - No data to display ? ?Initial Impression / Assessment and Plan / UC Course  ?I have reviewed the triage vital signs and the nursing notes. ? ?Pertinent labs & imaging results that were available during my care of the patient were reviewed by me and considered in my medical decision making (see chart for details). ? ?  ? ?Smoking cessation was recommended ?Final Clinical Impressions(s) / UC Diagnoses  ? ?Final diagnoses:  ?Acute bronchitis, unspecified organism  ? ? ? ?Discharge Instructions   ? ?  ?Drink lots of water ?Run a humidifier if you have 1 ?Take the antibiotic 2 times a day for 10 days ?Take the prednisone 2 times a day for 5 days ?I have given you a stronger cough medicine to use at bedtime.  During the day take a dextromethorphan product over-the-counter ?I have refilled your albuterol inhaler in case it is needed ?See  your doctor if not improving  over the next week or 2 ? ? ?ED Prescriptions   ? ? Medication Sig Dispense Auth. Provider  ? predniSONE (DELTASONE) 20 MG tablet Take 1 tablet (20 mg total) by mouth 2 (two) times daily with a meal. 10 tablet Raylene Everts, MD  ? albuterol (VENTOLIN HFA) 108 (90 Base) MCG/ACT inhaler Inhale 1-2 puffs into the lungs every 6 (six) hours as needed for wheezing or shortness of breath. 18 g Raylene Everts, MD  ? chlorpheniramine-HYDROcodone [facility]) 10-8 MG/5ML Take 5 mLs by mouth at bedtime as needed for cough. 70 mL Raylene Everts, MD  ? clarithromycin (BIAXIN) 250 MG tablet Take 1 tablet (250 mg total) by mouth 2 (two) times daily. 20 tablet Raylene Everts, MD  ? ?  ? ?PDMP not reviewed this encounter. ?  ?Raylene Everts, MD ?10/05/21 1321 ? ?

## 2021-10-16 ENCOUNTER — Emergency Department (INDEPENDENT_AMBULATORY_CARE_PROVIDER_SITE_OTHER): Payer: PRIVATE HEALTH INSURANCE

## 2021-10-16 ENCOUNTER — Encounter: Payer: Self-pay | Admitting: Emergency Medicine

## 2021-10-16 ENCOUNTER — Emergency Department (INDEPENDENT_AMBULATORY_CARE_PROVIDER_SITE_OTHER)
Admission: EM | Admit: 2021-10-16 | Discharge: 2021-10-16 | Disposition: A | Discharge status: Home / Self Care | Payer: PRIVATE HEALTH INSURANCE | Source: Home / Self Care

## 2021-10-16 ENCOUNTER — Inpatient Hospital Stay (HOSPITAL_BASED_OUTPATIENT_CLINIC_OR_DEPARTMENT_OTHER)
Admission: EM | Admit: 2021-10-16 | Discharge: 2021-10-20 | DRG: 193 | Disposition: A | Discharge status: Home / Self Care | Payer: PRIVATE HEALTH INSURANCE | Source: Ambulatory Visit | Attending: Family Medicine | Admitting: Family Medicine

## 2021-10-16 ENCOUNTER — Other Ambulatory Visit: Payer: Self-pay

## 2021-10-16 ENCOUNTER — Encounter (HOSPITAL_BASED_OUTPATIENT_CLINIC_OR_DEPARTMENT_OTHER): Payer: Self-pay | Admitting: Urology

## 2021-10-16 DIAGNOSIS — E871 Hypo-osmolality and hyponatremia: Secondary | ICD-10-CM | POA: Diagnosis present

## 2021-10-16 DIAGNOSIS — J40 Bronchitis, not specified as acute or chronic: Secondary | ICD-10-CM | POA: Diagnosis present

## 2021-10-16 DIAGNOSIS — E876 Hypokalemia: Secondary | ICD-10-CM | POA: Diagnosis present

## 2021-10-16 DIAGNOSIS — R509 Fever, unspecified: Secondary | ICD-10-CM

## 2021-10-16 DIAGNOSIS — Z20822 Contact with and (suspected) exposure to covid-19: Secondary | ICD-10-CM | POA: Diagnosis present

## 2021-10-16 DIAGNOSIS — J189 Pneumonia, unspecified organism: Principal | ICD-10-CM | POA: Diagnosis present

## 2021-10-16 DIAGNOSIS — Z9851 Tubal ligation status: Secondary | ICD-10-CM

## 2021-10-16 DIAGNOSIS — J302 Other seasonal allergic rhinitis: Secondary | ICD-10-CM | POA: Diagnosis present

## 2021-10-16 DIAGNOSIS — Z87891 Personal history of nicotine dependence: Secondary | ICD-10-CM

## 2021-10-16 DIAGNOSIS — J984 Other disorders of lung: Secondary | ICD-10-CM

## 2021-10-16 DIAGNOSIS — R059 Cough, unspecified: Secondary | ICD-10-CM

## 2021-10-16 DIAGNOSIS — Z79899 Other long term (current) drug therapy: Secondary | ICD-10-CM | POA: Diagnosis not present

## 2021-10-16 DIAGNOSIS — J9601 Acute respiratory failure with hypoxia: Secondary | ICD-10-CM | POA: Diagnosis present

## 2021-10-16 DIAGNOSIS — R0602 Shortness of breath: Secondary | ICD-10-CM

## 2021-10-16 DIAGNOSIS — Z9882 Breast implant status: Secondary | ICD-10-CM | POA: Diagnosis not present

## 2021-10-16 DIAGNOSIS — F17200 Nicotine dependence, unspecified, uncomplicated: Secondary | ICD-10-CM | POA: Diagnosis present

## 2021-10-16 LAB — COMPREHENSIVE METABOLIC PANEL
ALT: 23 U/L (ref 0–44)
AST: 16 U/L (ref 15–41)
Albumin: 3.5 g/dL (ref 3.5–5.0)
Alkaline Phosphatase: 71 U/L (ref 38–126)
Anion gap: 10 (ref 5–15)
BUN: 7 mg/dL (ref 6–20)
CO2: 26 mmol/L (ref 22–32)
Calcium: 8.6 mg/dL — ABNORMAL LOW (ref 8.9–10.3)
Chloride: 91 mmol/L — ABNORMAL LOW (ref 98–111)
Creatinine, Ser: 0.55 mg/dL (ref 0.44–1.00)
GFR, Estimated: >60 mL/min (ref >60)
Glucose, Bld: 124 mg/dL — ABNORMAL HIGH (ref 70–99)
Potassium: 3.5 mmol/L (ref 3.5–5.1)
Sodium: 127 mmol/L — ABNORMAL LOW (ref 135–145)
Total Bilirubin: 0.8 mg/dL (ref 0.3–1.2)
Total Protein: 7.2 g/dL (ref 6.5–8.1)

## 2021-10-16 LAB — RESP PANEL BY RT-PCR (FLU A&B, COVID) ARPGX2
Influenza A by PCR: NEGATIVE
Influenza B by PCR: NEGATIVE
SARS Coronavirus 2 by RT PCR: NEGATIVE

## 2021-10-16 LAB — CBC WITH DIFFERENTIAL/PLATELET
Abs Immature Granulocytes: 0.08 10*3/uL — ABNORMAL HIGH (ref 0.00–0.07)
Basophils Absolute: 0.1 10*3/uL (ref 0.0–0.1)
Basophils Relative: 0 %
Eosinophils Absolute: 0 10*3/uL (ref 0.0–0.5)
Eosinophils Relative: 0 %
HCT: 38.9 % (ref 36.0–46.0)
Hemoglobin: 13.5 g/dL (ref 12.0–15.0)
Immature Granulocytes: 0 %
Lymphocytes Relative: 8 %
Lymphs Abs: 1.5 10*3/uL (ref 0.7–4.0)
MCH: 32.2 pg (ref 26.0–34.0)
MCHC: 34.7 g/dL (ref 30.0–36.0)
MCV: 92.8 fL (ref 80.0–100.0)
Monocytes Absolute: 1.3 10*3/uL — ABNORMAL HIGH (ref 0.1–1.0)
Monocytes Relative: 7 %
Neutro Abs: 15.4 10*3/uL — ABNORMAL HIGH (ref 1.7–7.7)
Neutrophils Relative %: 85 %
Platelets: 241 10*3/uL (ref 150–400)
RBC: 4.19 MIL/uL (ref 3.87–5.11)
RDW: 12.2 % (ref 11.5–15.5)
WBC: 18.3 10*3/uL — ABNORMAL HIGH (ref 4.0–10.5)
nRBC: 0 % (ref 0.0–0.2)

## 2021-10-16 LAB — POCT INFLUENZA A/B
Influenza A, POC: NEGATIVE
Influenza B, POC: NEGATIVE

## 2021-10-16 LAB — PROTIME-INR
INR: 1 (ref 0.8–1.2)
Prothrombin Time: 13.5 seconds (ref 11.4–15.2)

## 2021-10-16 LAB — POC SARS CORONAVIRUS 2 AG -  ED: SARS Coronavirus 2 Ag: NEGATIVE

## 2021-10-16 LAB — LACTIC ACID, PLASMA: Lactic Acid, Venous: 0.8 mmol/L (ref 0.5–1.9)

## 2021-10-16 LAB — APTT: aPTT: 32 seconds (ref 24–36)

## 2021-10-16 MED ORDER — ALBUTEROL SULFATE (2.5 MG/3ML) 0.083% IN NEBU
2.5000 mg | INHALATION_SOLUTION | RESPIRATORY_TRACT | Status: DC | PRN
  Administered 2021-10-17 – 2021-10-19 (×6): 2.5 mg via RESPIRATORY_TRACT
  Filled 2021-10-16 (×6): qty 3

## 2021-10-16 MED ORDER — SODIUM CHLORIDE 0.9 % IV SOLN
500.0000 mg | INTRAVENOUS | Status: DC
  Administered 2021-10-17 – 2021-10-18 (×2): 500 mg via INTRAVENOUS
  Filled 2021-10-16 (×2): qty 5

## 2021-10-16 MED ORDER — LACTATED RINGERS IV BOLUS
1000.0000 mL | Freq: Once | INTRAVENOUS | Status: AC
  Administered 2021-10-16: 1000 mL via INTRAVENOUS

## 2021-10-16 MED ORDER — SODIUM CHLORIDE 0.9 % IV SOLN
1.0000 g | Freq: Once | INTRAVENOUS | Status: AC
  Administered 2021-10-16: 1 g via INTRAVENOUS
  Filled 2021-10-16: qty 10

## 2021-10-16 MED ORDER — ACETAMINOPHEN 500 MG PO TABS
1000.0000 mg | ORAL_TABLET | ORAL | Status: AC
  Administered 2021-10-16: 1000 mg via ORAL

## 2021-10-16 MED ORDER — ACETAMINOPHEN 650 MG RE SUPP: 650.0000 mg | Freq: Four times a day (QID) | RECTAL | Status: DC | PRN

## 2021-10-16 MED ORDER — SODIUM CHLORIDE 0.9 % IV SOLN
2.0000 g | INTRAVENOUS | Status: DC
  Administered 2021-10-17 – 2021-10-19 (×3): 2 g via INTRAVENOUS
  Filled 2021-10-16 (×3): qty 20

## 2021-10-16 MED ORDER — SODIUM CHLORIDE 0.9 % IV SOLN: INTRAVENOUS | Status: DC

## 2021-10-16 MED ORDER — ACETAMINOPHEN 325 MG PO TABS: 650.0000 mg | ORAL_TABLET | Freq: Four times a day (QID) | ORAL | Status: DC | PRN

## 2021-10-16 MED ORDER — SODIUM CHLORIDE 0.9 % IV SOLN
500.0000 mg | Freq: Once | INTRAVENOUS | Status: AC
  Administered 2021-10-16: 500 mg via INTRAVENOUS
  Filled 2021-10-16: qty 5

## 2021-10-16 MED ORDER — ENOXAPARIN SODIUM 40 MG/0.4ML IJ SOSY
40.0000 mg | PREFILLED_SYRINGE | INTRAMUSCULAR | Status: DC
  Administered 2021-10-17 – 2021-10-19 (×3): 40 mg via SUBCUTANEOUS
  Filled 2021-10-16 (×3): qty 0.4

## 2021-10-16 MED ORDER — ALBUTEROL SULFATE HFA 108 (90 BASE) MCG/ACT IN AERS: 1.0000 | INHALATION_SPRAY | Freq: Four times a day (QID) | RESPIRATORY_TRACT | Status: DC | PRN

## 2021-10-16 NOTE — ED Provider Notes (Signed)
?[facility] ? ? ? ?CSN: [id number] ?Arrival date & time: 10/16/21  1653 ? ? ?  ? ?History   ?Chief Complaint ?Chief Complaint  ?Patient presents with  ? Fever  ? ? ?HPI ?Mariann LasterLisa Standley is a 56 y.o. female.  ? ?HPI 56 year old female presents with fever, cough, increased fatigue, and loss of appetite.  Patient drove herself to our clinic today.  PMH significant for aortic atherosclerosis and nicotine dependence.  Patient reports smoking half a pack a day with 13-pack-year history.  Patient reports has not been smoking since coughing.  Patient was evaluated here for acute bronchitis on 10/05/2021 and was treated with albuterol, clarithromycin, Hycodan, and prednisone.  Patient reports taking and completing these medications as prescribed.  Patient's husband has presented to clinic after being called by RN. ? ?History reviewed. No pertinent past medical history. ? ?Patient Active Problem List  ? Diagnosis Date Noted  ? Allergic conjunctivitis of both eyes 09/09/2021  ? Aortic atherosclerosis (HCC) 05/20/2021  ? Well adult exam 01/29/2021  ? Nicotine dependence 01/28/2021  ? ? ?Past Surgical History:  ?Procedure Laterality Date  ? ABDOMINOPLASTY    ? AUGMENTATION MAMMAPLASTY Bilateral   ? PLACEMENT OF BREAST IMPLANTS    ? TUBAL LIGATION    ? ? ?OB History   ?No obstetric history on file. ?  ? ? ? ?Home Medications   ? ?Prior to Admission medications   ?Medication Sig Start Date End Date Taking? Authorizing Provider  ?albuterol (VENTOLIN HFA) 108 (90 Base) MCG/ACT inhaler Inhale 1-2 puffs into the lungs every 6 (six) hours as needed for wheezing or shortness of breath. 10/05/21  Yes Eustace MooreNelson, Yvonne Sue, MD  ?azelastine (OPTIVAR) 0.05 % ophthalmic solution Place 2 drops into both eyes 2 (two) times daily. 09/09/21   Jomarie LongsBreeback, Jade L, PA-C  ?chlorpheniramine-HYDROcodone (TUSSIONEX PENNKINETIC ER) 10-8 MG/5ML Take 5 mLs by mouth at bedtime as needed for cough. ?Patient not taking: Reported on 10/16/2021 10/05/21   Eustace MooreNelson,  Yvonne Sue, MD  ?clarithromycin (BIAXIN) 250 MG tablet Take 1 tablet (250 mg total) by mouth 2 (two) times daily. ?Patient not taking: Reported on 10/16/2021 10/05/21   Eustace MooreNelson, Yvonne Sue, MD  ?diazepam (VALIUM) 5 MG tablet Take 5 mg by mouth 2 (two) times daily. 06/16/21   [provider]  ?predniSONE (DELTASONE) 20 MG tablet Take 1 tablet (20 mg total) by mouth 2 (two) times daily with a meal. ?Patient not taking: Reported on 10/16/2021 10/05/21   Eustace MooreNelson, Yvonne Sue, MD  ? ? ?Family History ?Family History  ?Problem Relation Age of Onset  ? Heart failure Mother   ? Diabetes Brother   ? Stroke Maternal Aunt   ? ? ?Social History ?Social History  ? ?Tobacco Use  ? Smoking status: Every Day  ?  Packs/day: 0.40  ?  Years: 13.00  ?  Pack years: 5.20  ?  Types: Cigarettes  ? Smokeless tobacco: Never  ?Vaping Use  ? Vaping Use: Never used  ?Substance Use Topics  ? Alcohol use: Yes  ?  Alcohol/week: 1.0 - 2.0 standard drink  ?  Types: 1 - 2 Standard drinks or equivalent per week  ? Drug use: Never  ? ? ? ?Allergies   ?Patient has no known allergies. ? ? ?Review of Systems ?Review of Systems  ?Constitutional:  Positive for appetite change, fatigue and fever.  ?All other systems reviewed and are negative. ? ? ?Physical Exam ?Triage Vital Signs ?ED Triage Vitals  ?Enc Vitals Group  ?  BP 10/16/21 1717 124/87  ?   Pulse Rate 10/16/21 1717 (!) 119  ?   Resp 10/16/21 1717 (!) 22  ?   Temp 10/16/21 1717 (!) 101.2 ?F (38.4 ?C)  ?   Temp Source 10/16/21 1717 Oral  ?   SpO2 10/16/21 1717 (!) 89 %  ?   Weight 10/16/21 1716 117 lb (53.1 kg)  ?   Height --   ?   Head Circumference --   ?   Peak Flow --   ?   Pain Score 10/16/21 1719 5  ?   Pain Loc --   ?   Pain Edu? --   ?   Excl. in GC? --   ? ?No data found. ? ?Updated Vital Signs ?BP 124/87 (BP Location: Right Arm)   Pulse (!) 105   Temp 99.6 ?F (37.6 ?C) (Oral)   Resp (!) 22   Wt 117 lb (53.1 kg)   SpO2 (!) 89%   BMI 20.08 kg/m?  ? ?Physical Exam ?Vitals and nursing  note reviewed.  ?Constitutional:   ?   General: She is not in acute distress. ?   Appearance: Normal appearance. She is ill-appearing.  ?   Comments: Thin/lean in appearance  ?HENT:  ?   Head: Normocephalic and atraumatic.  ?   Right Ear: Tympanic membrane, ear canal and external ear normal.  ?   Left Ear: Tympanic membrane, ear canal and external ear normal.  ?   Mouth/Throat:  ?   Mouth: Mucous membranes are moist.  ?   Pharynx: Oropharynx is clear.  ?Eyes:  ?   Extraocular Movements: Extraocular movements intact.  ?   Conjunctiva/sclera: Conjunctivae normal.  ?   Pupils: Pupils are equal, round, and reactive to light.  ?Cardiovascular:  ?   Rate and Rhythm: Regular rhythm. Tachycardia present.  ?   Pulses: Normal pulses.  ?   Heart sounds: Normal heart sounds. No murmur heard. ?Pulmonary:  ?   Effort: Pulmonary effort is normal.  ?   Breath sounds: No wheezing, rhonchi or rales.  ?   Comments: Diminished breath sounds noted throughout, patient presents with pulse ox of 89% at triage, 2 L via nasal cannula was placed on patient, when removing for my exam, pulse ox dropped to 85%, patient placed back on 2 L via the nasal cannula for chest x-ray with pulse ox returning to 89% ?Musculoskeletal:  ?   Cervical back: Normal range of motion and neck supple.  ?Skin: ?   General: Skin is warm and dry.  ?Neurological:  ?   General: No focal deficit present.  ?   Mental Status: She is alert and oriented to person, place, and time.  ? ? ? ?UC Treatments / Results  ?Labs ?(all labs ordered are listed, but only abnormal results are displayed) ?Labs Reviewed  ?POC SARS CORONAVIRUS 2 AG -  ED  ?POCT INFLUENZA A/B  ? ? ?EKG ? ? ?Radiology ?DG Chest 2 View ? ?Result Date: 10/16/2021 ?CLINICAL DATA:  Cough and fever. EXAM: CHEST - 2 VIEW COMPARISON:  Chest x-ray 05/17/2021 FINDINGS: There are minimal patchy opacities in the right middle lobe. There is no pleural effusion or pneumothorax. Cardiomediastinal silhouette is within normal  limits. No acute fractures are seen. IMPRESSION: 1. Patchy opacities in the right middle lobe worrisome for infection. Followup PA and lateral chest X-ray is recommended in 3-4 weeks following trial of antibiotic therapy to ensure resolution and exclude underlying malignancy. Electronically Signed  By: Darliss Cheney M.D.   On: 10/16/2021 17:56   ? ?Procedures ?Procedures (including critical care time) ? ?Medications Ordered in UC ?Medications  ?acetaminophen (TYLENOL) tablet 1,000 mg (1,000 mg Oral Given 10/16/21 1728)  ? ? ?Initial Impression / Assessment and Plan / UC Course  ?I have reviewed the triage vital signs and the nursing notes. ? ?Pertinent labs & imaging results that were available during my care of the patient were reviewed by me and considered in my medical decision making (see chart for details). ? ?  ? ?MDM: 1.  Fever-Tylenol 1000 mg given to patient while in clinic, Influenza A/B, COVID-19; 2. Cough-CXR revealed-patchy opacities in right middle lobe worrisome for infection.  Radiology suggests follow-up CXR recommended in 3 to 4 weeks following trial of antibiotics to ensure resolution and exclude underlying malignancy.  Advised patient I spoke personally with radiologist Moise Boring, MD) to asked specifically what was concerning for underlying malignancy. Dr. Vickii Chafe reports no concerning/possible malignancy seen on this chest x-ray, appears to be mild pneumonia.  3.  Shortness of breath-currently on 2 L of oxygen via nasal cannula once oxygen removes SPO2 dropped from 94%-89% within several minutes.  Instructed Patient and husband to go to [facility] ED now for further evaluation ( to include advanced imaging, CT of chest, and serological testing.  Advised we have called charge nurse to make them aware of your arrival.  Patient/husband agreed and verbalized understanding of these instructions and this plan of care today.  Patient discharged, hemodynamically stable. ?Final Clinical  Impressions(s) / UC Diagnoses  ? ?Final diagnoses:  ?Fever, unspecified  ?Cough, unspecified type  ?Shortness of breath  ? ? ? ?Discharge Instructions   ? ?  ?Advised/informed patient of chest x-ray results today-p

## 2021-10-16 NOTE — ED Triage Notes (Signed)
Productive Cough and fever  ?States been on antibiotics for same  ?Sent from UC, SPO2 85% on RA there ? ?SPO2 90% on RA on arrival ?Respiratory Dee at bedside  ?Pt place on 3 L [place]  ? ?Neg COVID and FLU  ?Had tylenol for fever  ?

## 2021-10-16 NOTE — ED Notes (Signed)
PT 88% on arrival (room air). Placed PT on 3 LM and Sp02 increased to 94%. No respiratory distress at this time. BBS diminished- clear. RN aware. ?

## 2021-10-16 NOTE — ED Notes (Signed)
Patient is being discharged from the Urgent Care and sent to the Emergency Department via POV w/ s/o . Per M.Ragan, FNP, patient is in need of higher level of care due to  low O2 sats & fever. Patient is aware and verbalizes understanding of plan of care.  ?Vitals:  ? 10/16/21 1717 10/16/21 1821  ?BP: 124/87   ?Pulse: (!) 119 (!) 105  ?Resp: (!) 22 (!) 22  ?Temp: (!) 101.2 ?F (38.4 ?C) 99.6 ?F (37.6 ?C)  ?SpO2: (!) 89% (!) 89%  ?Current VS: 99.6 PO, RR 22, HR 105, no BP recheck O2 sats 95% at 2 L O2 via [place] Pt placed back on O2 prior to transfer to car at 2 L - Trial O2 sats on RA x 10 min- O2 sats remained at 89 %. Jennings Books, FNP verbally updated on current O2 sats.  ?

## 2021-10-16 NOTE — Progress Notes (Signed)
Plan of Care Note for accepted transfer ? ? ?Patient: Dana Khan MRN: [id number]   DOA: 10/16/2021 ? ?Facility [facility] transfer[facility] ?Requesting Provider: Dr. Pricilla Loveless ?Reason for transfer: Community-acquired pneumonia ?Facility course:  ?Patient is a 56 year old female with a past medical history of cigarette smoking presented with complaints of URI symptoms since February.  Symptoms now worse with cough/increased sputum production and fevers.  She went to urgent care and chest x-ray revealed right middle lobe opacities concerning for infection.  SPO2 88% on room air, improved with 3 L supplemental oxygen.  Labs notable for WBC 18.3, sodium 127.  Lactate normal.  Blood cultures drawn.  COVID and flu negative.  Patient was given ceftriaxone, azithromycin, and 1 L LR bolus. ? ?Plan of care: ?The patient is accepted for admission to Telemetry unit, at [facility]. ? ?Author: ?Dana Giovanni, MD ?10/16/2021 ? ?Check [email or web address] for on-call coverage. ? ?Nursing staff, Please call [facility] Admits & Consults System-Wide number on Amion as soon as patient's arrival, so appropriate admitting provider can evaluate the pt. ?

## 2021-10-16 NOTE — ED Notes (Signed)
Report called to [facility] ED RN at 1836, this RN made RN receiving report that patient would need O2 on arrival. Current VS reported. Pt's husband verbalized an understanding that he needed to go straight to the ED, no other stops. No other questions at this time.  ?

## 2021-10-16 NOTE — Discharge Instructions (Addendum)
Advised/informed patient of chest x-ray results today-patchy opacities in right middle lobe worrisome for infection.  Radiology suggests follow-up CXR recommended in 3 to 4 weeks following trial of antibiotics to ensure resolution and exclude underlying malignancy.  Advised patient I spoke personally with radiologist Moise Boring, MD) to asked specifically what was concerning for underlying malignancy. Dr. Vickii Chafe reports no concerning/possible malignancy seen on this chest x-ray, she reported appears to be mild pneumonia. Instructed Patient and husband to go to [facility] ED NOW for further evaluation ( to include advanced imaging, CT of chest, and serological testing.  Advised we have called charge nurse to make them aware of your arrival. ?

## 2021-10-16 NOTE — H&P (Signed)
?History and Physical  ? ? ?[place] [id number] DOB: [birth year] DOA: 10/16/2021 ? ?PCP: Luetta Nutting, DO  ?Patient coming from: Home. ? ?Chief Complaint: Shortness of breath and cough. ? ?HPI: Dana Khan is a 56 y.o. female with history of tobacco abuse which patient states she quit few days ago and has been having upper respiratory tract symptoms with sinus congestion and cough for almost a month.  Patient's primary care physician had prescribed antibiotics which patient took for 10 days last dose was about 3 days ago.  Over the last 3 days patient has become more short of breath productive cough subjective feeling of fever chills.  Patient has been having productive sputum greenish in color. ? ?ED Course: In the ER patient was having of temperature 101 ?F with hypoxia requiring 3 L oxygen.  Chest x-ray was compatible with right middle lobe pneumonia.  COVID test and influenza test were negative.  Given the hypoxia leukocytosis fever patient admitted for further work-up for pneumonia. ? ?Review of Systems: As per HPI, rest all negative. ? ? ?History reviewed. No pertinent past medical history. ? ?Past Surgical History:  ?Procedure Laterality Date  ? ABDOMINOPLASTY    ? AUGMENTATION MAMMAPLASTY Bilateral   ? PLACEMENT OF BREAST IMPLANTS    ? TUBAL LIGATION    ? ? ? reports that she has been smoking cigarettes. She has a 5.20 pack-year smoking history. She has never used smokeless tobacco. She reports current alcohol use of about 1.0 - 2.0 standard drink per week. She reports that she does not use drugs. ? ?No Known Allergies ? ?Family History  ?Problem Relation Age of Onset  ? Heart failure Mother   ? Diabetes Brother   ? Stroke Maternal Aunt   ? ? ?Prior to Admission medications   ?Medication Sig Start Date End Date Taking? Authorizing Provider  ?albuterol (VENTOLIN HFA) 108 (90 Base) MCG/ACT inhaler Inhale 1-2 puffs into the lungs every 6 (six) hours as needed for wheezing or shortness of breath. 10/05/21    Raylene Everts, MD  ?azelastine (OPTIVAR) 0.05 % ophthalmic solution Place 2 drops into both eyes 2 (two) times daily. 09/09/21   Donella Stade, PA-C  ?chlorpheniramine-HYDROcodone (TUSSIONEX PENNKINETIC ER) 10-8 MG/5ML Take 5 mLs by mouth at bedtime as needed for cough. ?Patient not taking: Reported on 10/16/2021 10/05/21   Raylene Everts, MD  ?clarithromycin (BIAXIN) 250 MG tablet Take 1 tablet (250 mg total) by mouth 2 (two) times daily. ?Patient not taking: Reported on 10/16/2021 10/05/21   Raylene Everts, MD  ?diazepam (VALIUM) 5 MG tablet Take 5 mg by mouth 2 (two) times daily. 06/16/21   [provider]  ?predniSONE (DELTASONE) 20 MG tablet Take 1 tablet (20 mg total) by mouth 2 (two) times daily with a meal. ?Patient not taking: Reported on 10/16/2021 10/05/21   Raylene Everts, MD  ? ? ?Physical Exam: ?Constitutional: Moderately built and nourished. ?Vitals:  ? 10/16/21 2130 10/16/21 2215 10/16/21 2230 10/16/21 2321  ?BP: 135/80 125/79 129/79 123/76  ?Pulse: (!) 101 (!) 103 (!) 108 98  ?Resp: 20 20 20 16   ?Temp: 98.3 ?F (36.8 ?C) 98.3 ?F (36.8 ?C) 98.3 ?F (36.8 ?C) 98.8 ?F (37.1 ?C)  ?TempSrc: Oral Oral Oral Oral  ?SpO2: 93% 94% 92% 94%  ?Weight:      ?Height:      ? ?Eyes: Anicteric no pallor. ?ENMT: No discharge from the ears eyes nose and mouth. ?Neck: No mass felt.  No neck  rigidity. ?Respiratory: No rhonchi or crepitations. ?Cardiovascular: S1-S2 heard. ?Abdomen: Soft nontender bowel sound present. ?Musculoskeletal: No edema. ?Skin: No rash. ?Neurologic: Alert awake oriented time place and person.  Moves all extremities. ?Psychiatric: Appears normal.  Normal affect. ? ? ?Labs on Admission: I have personally reviewed following labs and imaging studies ? ?CBC: ?Recent Labs  ?Lab 10/16/21 ?1942  ?WBC 18.3*  ?NEUTROABS 15.4*  ?HGB 13.5  ?HCT 38.9  ?MCV 92.8  ?PLT 241  ? ?Basic Metabolic Panel: ?Recent Labs  ?Lab 10/16/21 ?1942  ?NA 127*  ?K 3.5  ?CL 91*  ?CO2 26  ?GLUCOSE 124*  ?BUN 7   ?CREATININE 0.55  ?CALCIUM 8.6*  ? ?GFR: ?Estimated Creatinine Clearance: 67.3 mL/min (by C-G formula based on SCr of 0.55 mg/dL). ?Liver Function Tests: ?Recent Labs  ?Lab 10/16/21 ?1942  ?AST 16  ?ALT 23  ?ALKPHOS 71  ?BILITOT 0.8  ?PROT 7.2  ?ALBUMIN 3.5  ? ?No results for input(s): LIPASE, AMYLASE in the last 168 hours. ?No results for input(s): AMMONIA in the last 168 hours. ?Coagulation Profile: ?Recent Labs  ?Lab 10/16/21 ?1942  ?INR 1.0  ? ?Cardiac Enzymes: ?No results for input(s): CKTOTAL, CKMB, CKMBINDEX, TROPONINI in the last 168 hours. ?BNP (last 3 results) ?No results for input(s): PROBNP in the last 8760 hours. ?HbA1C: ?No results for input(s): HGBA1C in the last 72 hours. ?CBG: ?No results for input(s): GLUCAP in the last 168 hours. ?Lipid Profile: ?No results for input(s): CHOL, HDL, LDLCALC, TRIG, CHOLHDL, LDLDIRECT in the last 72 hours. ?Thyroid Function Tests: ?No results for input(s): TSH, T4TOTAL, FREET4, T3FREE, THYROIDAB in the last 72 hours. ?Anemia Panel: ?No results for input(s): VITAMINB12, FOLATE, FERRITIN, TIBC, IRON, RETICCTPCT in the last 72 hours. ?Urine analysis: ?No results found for: COLORURINE, APPEARANCEUR, [place], [place], [place], [place], [place], KETONESUR, PROTEINUR, [place], NITRITE, LEUKOCYTESUR ?Sepsis Labs: ?@[email or web address] (procalcitonin:4,lacticidven:4) ?) ?Recent Results (from the past 240 hour(s))  ?Resp Panel by RT-PCR (Flu A&B, Covid) Nasopharyngeal Swab     Status: None  ? Collection Time: 10/16/21  7:55 PM  ? Specimen: Nasopharyngeal Swab; Nasopharyngeal(NP) swabs in vial transport medium  ?Result Value Ref Range Status  ? SARS Coronavirus 2 by RT PCR NEGATIVE NEGATIVE Final  ?  Comment: (NOTE) ?SARS-CoV-2 target nucleic acids are NOT DETECTED. ? ?The SARS-CoV-2 RNA is generally detectable in upper respiratory ?specimens during the acute phase of infection. The lowest ?concentration of SARS-CoV-2 viral copies this assay can detect is ?138 copies/mL. A  negative result does not preclude SARS-Cov-2 ?infection and should not be used as the sole basis for treatment or ?other patient management decisions. A negative result may occur with  ?improper specimen collection/handling, submission of specimen other ?than nasopharyngeal swab, presence of viral mutation(s) within the ?areas targeted by this assay, and inadequate number of viral ?copies(<138 copies/mL). A negative result must be combined with ?clinical observations, patient history, and epidemiological ?information. The expected result is Negative. ? ?Fact Sheet for Patients:  ?EntrepreneurPulse.com.au ? ?Fact Sheet for Healthcare Providers:  ?IncredibleEmployment.be ? ?This test is no t yet approved or cleared by the [place] FDA and  ?has been authorized for detection and/or diagnosis of SARS-CoV-2 by ?FDA under an Emergency Use Authorization (EUA). This EUA will remain  ?in effect (meaning this test can be used) for the duration of the ?COVID-19 declaration under Section 564(b)(1) of the Act, 21 ?U.S.C.section 360bbb-3(b)(1), unless the authorization is terminated  ?or revoked sooner.  ? ? ?  ? Influenza A by PCR NEGATIVE NEGATIVE Final  ?  Influenza B by PCR NEGATIVE NEGATIVE Final  ?  Comment: (NOTE) ?The Xpert Xpress SARS-CoV-2/FLU/RSV plus assay is intended as an aid ?in the diagnosis of influenza from Nasopharyngeal swab specimens and ?should not be used as a sole basis for treatment. Nasal washings and ?aspirates are unacceptable for Xpert Xpress SARS-CoV-2/FLU/RSV ?testing. ? ?Fact Sheet for Patients: ?EntrepreneurPulse.com.au ? ?Fact Sheet for Healthcare Providers: ?IncredibleEmployment.be ? ?This test is not yet approved or cleared by the [place] FDA and ?has been authorized for detection and/or diagnosis of SARS-CoV-2 by ?FDA under an Emergency Use Authorization (EUA). This EUA will remain ?in effect (meaning this test can  be used) for the duration of the ?COVID-19 declaration under Section 564(b)(1) of the Act, 21 U.S.C. ?section 360bbb-3(b)(1), unless the authorization is terminated or ?revoked. ? ?Performed at [facility]

## 2021-10-16 NOTE — ED Notes (Signed)
Pt back to room- O2 sats 94% on 2 L. Off O2  to check O2 sat while provider in room. Pt does not want to go to any other hospital due to insurance reasons. Pt is a [employer]. O2 sats on RA 89%. Pt's husband to drive pt to [facility] ED for higher lever of care  ?

## 2021-10-16 NOTE — ED Provider Notes (Signed)
?[facility] EMERGENCY DEPARTMENT ?Provider Note ? ? ?CSN: [id number] ?Arrival date & time: 10/16/21  1847 ? ?  ? ?History ? ?Chief Complaint  ?Patient presents with  ? Shortness of Breath  ? ? ?Dana Khan is a 56 y.o. female. ? ?HPI ?56 year old female presents with pneumonia and hypoxia.  She has been battling respiratory symptoms since February.  She recently finished cough medicine, steroids, and antibiotics.  Never seem to get much better.  Over the last couple days she is gotten worse and now has a temperature and worse shortness of breath and cough.  Is coughing up green sputum which is the same as her nasal congestion.  She smokes and has smoked since she was 56 years old.  She denies a known history of asthma or COPD.  She went to urgent care where she was found to have mild hypoxia and x-ray consistent with pneumonia and was sent here. ? ?Home Medications ?Prior to Admission medications   ?Medication Sig Start Date End Date Taking? Authorizing Provider  ?albuterol (VENTOLIN HFA) 108 (90 Base) MCG/ACT inhaler Inhale 1-2 puffs into the lungs every 6 (six) hours as needed for wheezing or shortness of breath. 10/05/21   Eustace MooreNelson, Yvonne Sue, MD  ?azelastine (OPTIVAR) 0.05 % ophthalmic solution Place 2 drops into both eyes 2 (two) times daily. 09/09/21   Jomarie LongsBreeback, Jade L, PA-C  ?chlorpheniramine-HYDROcodone (TUSSIONEX PENNKINETIC ER) 10-8 MG/5ML Take 5 mLs by mouth at bedtime as needed for cough. ?Patient not taking: Reported on 10/16/2021 10/05/21   Eustace MooreNelson, Yvonne Sue, MD  ?clarithromycin (BIAXIN) 250 MG tablet Take 1 tablet (250 mg total) by mouth 2 (two) times daily. ?Patient not taking: Reported on 10/16/2021 10/05/21   Eustace MooreNelson, Yvonne Sue, MD  ?diazepam (VALIUM) 5 MG tablet Take 5 mg by mouth 2 (two) times daily. 06/16/21   [provider]  ?predniSONE (DELTASONE) 20 MG tablet Take 1 tablet (20 mg total) by mouth 2 (two) times daily with a meal. ?Patient not taking: Reported on 10/16/2021 10/05/21    Eustace MooreNelson, Yvonne Sue, MD  ?   ? ?Allergies    ?Patient has no known allergies.   ? ?Review of Systems   ?Review of Systems  ?Constitutional:  Positive for fever.  ?HENT:  Positive for congestion.   ?Respiratory:  Positive for cough and shortness of breath.   ?Cardiovascular:  Negative for leg swelling.  ?Gastrointestinal:  Negative for vomiting.  ? ?Physical Exam ?Updated Vital Signs ?BP 135/80 (BP Location: Left Arm)   Pulse (!) 101   Temp 98.3 ?F (36.8 ?C) (Oral)   Resp 20   Ht 5\' 4"  (1.626 m)   Wt 53 kg   SpO2 93%   BMI 20.06 kg/m?  ?Physical Exam ?Vitals and nursing note reviewed.  ?Constitutional:   ?   Appearance: She is well-developed. She is not ill-appearing or diaphoretic.  ?HENT:  ?   Head: Normocephalic and atraumatic.  ?Cardiovascular:  ?   Rate and Rhythm: Normal rate and regular rhythm.  ?   Heart sounds: Normal heart sounds.  ?Pulmonary:  ?   Effort: Pulmonary effort is normal.  ?   Breath sounds: Examination of the right-lower field reveals rales. Rales present.  ?Abdominal:  ?   Palpations: Abdomen is soft.  ?   Tenderness: There is no abdominal tenderness.  ?Musculoskeletal:  ?   Right lower leg: No edema.  ?   Left lower leg: No edema.  ?Skin: ?   General: Skin is warm  and dry.  ?Neurological:  ?   Mental Status: She is alert.  ? ? ?ED Results / Procedures / Treatments   ?Labs ?(all labs ordered are listed, but only abnormal results are displayed) ?Labs Reviewed  ?COMPREHENSIVE METABOLIC PANEL - Abnormal; Notable for the following components:  ?    Result Value  ? Sodium 127 (*)   ? Chloride 91 (*)   ? Glucose, Bld 124 (*)   ? Calcium 8.6 (*)   ? All other components within normal limits  ?CBC WITH DIFFERENTIAL/PLATELET - Abnormal; Notable for the following components:  ? WBC 18.3 (*)   ? Neutro Abs 15.4 (*)   ? Monocytes Absolute 1.3 (*)   ? Abs Immature Granulocytes 0.08 (*)   ? All other components within normal limits  ?RESP PANEL BY RT-PCR (FLU A&B, COVID) [id number]  ?CULTURE, BLOOD  (ROUTINE X 2)  ?CULTURE, BLOOD (ROUTINE X 2)  ?LACTIC ACID, PLASMA  ?PROTIME-INR  ?APTT  ? ? ?EKG ?EKG Interpretation ? ?Date/Time:  Wednesday October 16 2021 19:32:41 EDT ?Ventricular Rate:  92 ?PR Interval:  139 ?QRS Duration: 103 ?QT Interval:  382 ?QTC Calculation: 473 ?R Axis:   95 ?Text Interpretation: Sinus rhythm Borderline right axis deviation No old tracing to compare Confirmed by Pricilla Loveless [phone]) on 10/16/2021 7:52:58 PM ? ?Radiology ?DG Chest 2 View ? ?Result Date: 10/16/2021 ?CLINICAL DATA:  Cough and fever. EXAM: CHEST - 2 VIEW COMPARISON:  Chest x-ray 05/17/2021 FINDINGS: There are minimal patchy opacities in the right middle lobe. There is no pleural effusion or pneumothorax. Cardiomediastinal silhouette is within normal limits. No acute fractures are seen. IMPRESSION: 1. Patchy opacities in the right middle lobe worrisome for infection. Followup PA and lateral chest X-ray is recommended in 3-4 weeks following trial of antibiotic therapy to ensure resolution and exclude underlying malignancy. Electronically Signed   By: Darliss Cheney M.D.   On: 10/16/2021 17:56   ? ?Procedures ?Procedures  ? ? ?Medications Ordered in ED ?Medications  ?lactated ringers bolus 1,000 mL ( Intravenous Stopped 10/16/21 2127)  ?cefTRIAXone (ROCEPHIN) 1 g in sodium chloride 0.9 % 100 mL IVPB (0 g Intravenous Stopped 10/16/21 2043)  ?azithromycin (ZITHROMAX) 500 mg in sodium chloride 0.9 % 250 mL IVPB (0 mg Intravenous Stopped 10/16/21 2117)  ? ? ?ED Course/ Medical Decision Making/ A&P ?  ?                        ?Medical Decision Making ?Amount and/or Complexity of Data Reviewed ?Labs: ordered. ?ECG/medicine tests: ordered. ? ?Risk ?Decision regarding hospitalization. ? ? ?Patient presents with pneumonia from the urgent care.  Chest x-ray images from there viewed and interpreted by myself and there is pneumonia on the right side.  She is mildly hypoxic requiring supplemental oxygen.  She was started on IV Rocephin,  azithromycin, and a liter of fluid.  She is in not in distress.  ECG shows no acute ischemia.  Labs show a normal lactate but does have a significant leukocytosis of 18.  Due to all this she will need admission.  I have discussed with Dr. Loney Loh, who will admit. ? ? ? ? ? ? ? ?Final Clinical Impression(s) / ED Diagnoses ?Final diagnoses:  ?Acute respiratory failure with hypoxia (HCC)  ?Community acquired pneumonia of right middle lobe of lung  ? ? ?Rx / DC Orders ?ED Discharge Orders   ? ? None  ? ?  ? ? ?  ?Pricilla Loveless,  MD ?10/16/21 2156 ? ?

## 2021-10-16 NOTE — ED Notes (Signed)
Husband to bedside - updated. Pt in Xray - remains on 2 L O2 vis [place] w/ HR 112 at 94%.  ?

## 2021-10-16 NOTE — ED Notes (Signed)
O2 at 2 L [place] at 1728 per orders ?

## 2021-10-16 NOTE — ED Triage Notes (Addendum)
Fever came back 2 days ago  ?Stopped smoking since last visit due to coughing ?Tylenol Cold & flu at 12 noon ?Increase fatigue  ?Loss of appetite - wt loss  ?Pt drove self to [facility]  ?O2 sats on RA 85%- placed on 2 L at 2 LNC  ?

## 2021-10-16 NOTE — Patient Instructions (Signed)
Instructed patient on the proper use of using a flutter valve. Patient able to demonstrate proper use X 10 and instructed to use 10 times per hour while awake. Patient tolerated well. ?

## 2021-10-17 DIAGNOSIS — J984 Other disorders of lung: Secondary | ICD-10-CM

## 2021-10-17 LAB — CBC
HCT: 42.4 % (ref 36.0–46.0)
Hemoglobin: 13.8 g/dL (ref 12.0–15.0)
MCH: 31.6 pg (ref 26.0–34.0)
MCHC: 32.5 g/dL (ref 30.0–36.0)
MCV: 97 fL (ref 80.0–100.0)
Platelets: 242 10*3/uL (ref 150–400)
RBC: 4.37 MIL/uL (ref 3.87–5.11)
RDW: 12.3 % (ref 11.5–15.5)
WBC: 16.4 10*3/uL — ABNORMAL HIGH (ref 4.0–10.5)
nRBC: 0 % (ref 0.0–0.2)

## 2021-10-17 LAB — CBC WITH DIFFERENTIAL/PLATELET
Abs Immature Granulocytes: 0.08 10*3/uL — ABNORMAL HIGH (ref 0.00–0.07)
Basophils Absolute: 0 10*3/uL (ref 0.0–0.1)
Basophils Relative: 0 %
Eosinophils Absolute: 0 10*3/uL (ref 0.0–0.5)
Eosinophils Relative: 0 %
HCT: 38.3 % (ref 36.0–46.0)
Hemoglobin: 12.4 g/dL (ref 12.0–15.0)
Immature Granulocytes: 1 %
Lymphocytes Relative: 9 %
Lymphs Abs: 1.3 10*3/uL (ref 0.7–4.0)
MCH: 31.2 pg (ref 26.0–34.0)
MCHC: 32.4 g/dL (ref 30.0–36.0)
MCV: 96.5 fL (ref 80.0–100.0)
Monocytes Absolute: 1.3 10*3/uL — ABNORMAL HIGH (ref 0.1–1.0)
Monocytes Relative: 9 %
Neutro Abs: 11.5 10*3/uL — ABNORMAL HIGH (ref 1.7–7.7)
Neutrophils Relative %: 81 %
Platelets: 223 10*3/uL (ref 150–400)
RBC: 3.97 MIL/uL (ref 3.87–5.11)
RDW: 12.3 % (ref 11.5–15.5)
WBC: 14.2 10*3/uL — ABNORMAL HIGH (ref 4.0–10.5)
nRBC: 0 % (ref 0.0–0.2)

## 2021-10-17 LAB — EXPECTORATED SPUTUM ASSESSMENT W GRAM STAIN, RFLX TO RESP C

## 2021-10-17 LAB — BASIC METABOLIC PANEL
Anion gap: 7 (ref 5–15)
Anion gap: 8 (ref 5–15)
BUN: 5 mg/dL — ABNORMAL LOW (ref 6–20)
BUN: <5 mg/dL — ABNORMAL LOW (ref 6–20)
CO2: 28 mmol/L (ref 22–32)
CO2: 28 mmol/L (ref 22–32)
Calcium: 8.2 mg/dL — ABNORMAL LOW (ref 8.9–10.3)
Calcium: 8.5 mg/dL — ABNORMAL LOW (ref 8.9–10.3)
Chloride: 100 mmol/L (ref 98–111)
Chloride: 99 mmol/L (ref 98–111)
Creatinine, Ser: 0.36 mg/dL — ABNORMAL LOW (ref 0.44–1.00)
Creatinine, Ser: 0.41 mg/dL — ABNORMAL LOW (ref 0.44–1.00)
GFR, Estimated: >60 mL/min (ref >60)
GFR, Estimated: >60 mL/min (ref >60)
Glucose, Bld: 109 mg/dL — ABNORMAL HIGH (ref 70–99)
Glucose, Bld: 121 mg/dL — ABNORMAL HIGH (ref 70–99)
Potassium: 3.4 mmol/L — ABNORMAL LOW (ref 3.5–5.1)
Potassium: 3.6 mmol/L (ref 3.5–5.1)
Sodium: 134 mmol/L — ABNORMAL LOW (ref 135–145)
Sodium: 136 mmol/L (ref 135–145)

## 2021-10-17 LAB — HIV ANTIBODY (ROUTINE TESTING W REFLEX): HIV Screen 4th Generation wRfx: NONREACTIVE

## 2021-10-17 LAB — STREP PNEUMONIAE URINARY ANTIGEN: Strep Pneumo Urinary Antigen: NEGATIVE

## 2021-10-17 LAB — OSMOLALITY, URINE: Osmolality, Ur: 196 mOsm/kg — ABNORMAL LOW (ref 300–900)

## 2021-10-17 LAB — SODIUM, URINE, RANDOM: Sodium, Ur: 41 mmol/L

## 2021-10-17 MED ORDER — METHYLPREDNISOLONE SODIUM SUCC 40 MG IJ SOLR
40.0000 mg | Freq: Three times a day (TID) | INTRAMUSCULAR | Status: DC
  Administered 2021-10-17 – 2021-10-18 (×4): 40 mg via INTRAVENOUS
  Filled 2021-10-17 (×4): qty 1

## 2021-10-17 MED ORDER — GUAIFENESIN 100 MG/5ML PO LIQD
10.0000 mL | ORAL | Status: DC | PRN
  Administered 2021-10-17 – 2021-10-19 (×10): 10 mL via ORAL
  Filled 2021-10-17 (×9): qty 10

## 2021-10-17 MED ORDER — FLUTICASONE PROPIONATE 50 MCG/ACT NA SUSP
1.0000 | Freq: Every day | NASAL | Status: DC
  Administered 2021-10-17 – 2021-10-20 (×4): 1 via NASAL
  Filled 2021-10-17: qty 16

## 2021-10-17 MED ORDER — ADULT MULTIVITAMIN W/MINERALS CH
1.0000 | ORAL_TABLET | Freq: Every day | ORAL | Status: DC
  Administered 2021-10-17 – 2021-10-20 (×4): 1 via ORAL
  Filled 2021-10-17 (×4): qty 1

## 2021-10-17 MED ORDER — LORATADINE 10 MG PO TABS
10.0000 mg | ORAL_TABLET | Freq: Every day | ORAL | Status: DC
  Administered 2021-10-17 – 2021-10-20 (×4): 10 mg via ORAL
  Filled 2021-10-17 (×4): qty 1

## 2021-10-17 MED ORDER — BOOST / RESOURCE BREEZE PO LIQD CUSTOM
1.0000 | ORAL | Status: DC
  Administered 2021-10-18 – 2021-10-20 (×3): 1 via ORAL

## 2021-10-17 MED ORDER — ENSURE ENLIVE PO LIQD
237.0000 mL | ORAL | Status: DC
  Administered 2021-10-17 – 2021-10-19 (×3): 237 mL via ORAL

## 2021-10-17 NOTE — Progress Notes (Signed)
?PROGRESS NOTE ?Praesel  [id number] DOB: [birth year] DOA: 10/16/2021 ?PCP: Luetta Nutting, DO  ? ?Brief Narrative/Hospital Course: ?61 yof w/ history of tobacco abuse, abdominoplasty, who quit smoking a few days ago, has been having upper respiratory tract symptoms with sinus congestion and cough for almost a month, saw PCP  and completed prescribed 10 days of antibiotics 3 days PTA , for the last 3 days and becoming more short of breath with cough subjective feeling of fever and chills cough with greenish sputum, came to the ED ?In ED febrile 101, hypoxic and needing supplemental oxygen chest x-ray with right middle lobe pneumonia COVID-19 influenza negative .patient was placed on IV antibiotic and admitted for further management   ?  ?Subjective: ?Seen and examined this morning.  Still having cough with lots of phlegm ?Overall feels the same, lots of postnasal drip ?Overnight afebrile ?Endorses she does get a lot of allergy symptoms during pollen time ? ?Assessment and Plan: ?Principal Problem: ?  CAP (community acquired pneumonia) ?Active Problems: ?  Nicotine dependence ?  Hyponatremia ?  Acute pulmonary insufficiency ?  ?CAP: xray with right middle lobe pneumonia, labs showed leukocytosis.  Patient tobacco abuse recently quit, symptoms present for 1 month, had completed 10 days of oral antibiotics.  Keep on IV antibiotics monitor labs, check urine antigen, sputum culture.  Add Claritin/Flonase/IV steroid antitussives, nebs ? ?Seasonal allergy ?Subacute cough ?Bronchitis ?Longstanding smoking history-?  COPD likely undiagnosed: ?Presentation multifactorial due to pneumonia also with seasonal allergy.  We will go going to start on Claritin and Flonase, IV steroids-patient has diffuse wheezing.  Will need lung function/PFT as outpatient to diagnose COPD. ? ?Acute respiratory failure with hypoxia, pulse ox 84% at urgent care, 89% in the ED placed on 3 to 4 L nasal cannula.. wean as tolerated. ? ?Nicotine  dependence:Recently quit ?Hyponatremia: Resolved ? ?DVT prophylaxis: enoxaparin (LOVENOX) injection 40 mg Start: 10/17/21 1000 ?Code Status:   Code Status: Full Code ?Family Communication: plan of care discussed with patient/ at bedside. ? ?Disposition: Currently not medically stable for discharge. ?Status is: Inpatient ?Remains inpatient appropriate because: Ongoing management of pneumonia ?Objective: ?Vitals last 24 hrs: ?Vitals:  ? 10/16/21 2230 10/16/21 2321 10/17/21 0415 10/17/21 0911  ?BP: 129/79 123/76 102/75   ?Pulse: (!) 108 98 100 96  ?Resp: 20 18 16 20   ?Temp: 98.3 ?F (36.8 ?C) 98.8 ?F (37.1 ?C) 98.8 ?F (37.1 ?C)   ?TempSrc: Oral Oral Oral   ?SpO2: 92% 94% 94% 90%  ?Weight:      ?Height:      ? ?Weight change:  ? ?Physical Examination: ?General exam: AA OX3,older than stated age, weak appearing. ?HEENT:Oral mucosa moist, Ear/Nose WNL grossly, dentition normal. ?Respiratory system: bilaterally diminished BS with expiratory wheezing, no use of accessory muscle ?Cardiovascular system: S1 & S2 +, No JVD,. ?Gastrointestinal system: Abdomen soft,NT,ND, BS+ ?Nervous System:Alert, awake, moving extremities and grossly nonfocal ?Extremities: LE edema NONE,distal peripheral pulses palpable.  ?Skin: No rashes,no icterus. ?MSK: Normal muscle bulk,tone, power ? ?Medications reviewed:  ?Scheduled Meds: ? enoxaparin (LOVENOX) injection  40 mg Subcutaneous Q24H  ? fluticasone  1 spray Each Nare Daily  ? loratadine  10 mg Oral Daily  ? methylPREDNISolone (SOLU-MEDROL) injection  40 mg Intravenous Q8H  ? ?Continuous Infusions: ? sodium chloride 75 mL/hr at 10/17/21 0025  ? azithromycin    ? cefTRIAXone (ROCEPHIN)  IV    ? ? ?  ?Diet Order   ? ?       ?  Diet regular Room service appropriate? Yes; Fluid consistency: Thin  Diet effective now       ?  ? ?  ?  ? ?  ?  ? ?  ?  ?  ? ? ?Intake/Output Summary (Last 24 hours) at 10/17/2021 0925 ?Last data filed at 10/17/2021 0200 ?Gross per 24 hour  ?Intake 1686.91 ml  ?Output 200 ml   ?Net 1486.91 ml  ? ?Net IO Since Admission: 1,486.91 mL [10/17/21 0925]  ?Wt Readings from Last 3 Encounters:  ?23-Oct-2021 53 kg  ?10-23-21 53.1 kg  ?10/05/21 55.3 kg  ?  ? ?Unresulted Labs (From admission, onward)  ? ?  Start     Ordered  ? 10/23/21 0500  Creatinine, serum  (enoxaparin (LOVENOX)    CrCl >/= 30 ml/min)  Weekly,   R     ?Comments: while on enoxaparin therapy ?  ? 2021/10/23 2341  ? 10/17/21 0214  Expectorated Sputum Assessment w Gram Stain, Rflx to Resp Cult  Once,   R       ? 10/17/21 0213  ? 2021-10-23 2338  HIV Antibody (routine testing w rflx)  (HIV Antibody (Routine testing w reflex) panel)  Once,   R       ? 10/23/21 2341  ? 2021/10/23 2338  Legionella Pneumophila Serogp 1 Ur Ag  Once,   R       ? Oct 23, 2021 2341  ? October 23, 2021 1919  Blood Culture (routine x 2)  (Undifferentiated presentation (screening labs and basic nursing orders))  BLOOD CULTURE X 2,   STAT     ? 23-Oct-2021 1919  ? ?  ?  ? ?  ?Data Reviewed: I have personally reviewed following labs and imaging studies ?CBC: ?Recent Labs  ?Lab 23-Oct-2021 ?1942 10/17/21 ?0006 10/17/21 ?[id number]  ?WBC 18.3* 16.4* 14.2*  ?NEUTROABS 15.4*  --  11.5*  ?HGB 13.5 13.8 12.4  ?HCT 38.9 42.4 38.3  ?MCV 92.8 97.0 96.5  ?PLT 241 242 223  ? ?Basic Metabolic Panel: ?Recent Labs  ?Lab 2021/10/23 ?1941 10/17/21 ?0006 10/17/21 ?[id number]  ?NA 127* 136 134*  ?K 3.5 3.6 3.4*  ?CL 91* 100 99  ?CO2 26 28 28   ?GLUCOSE 124* 121* 109*  ?BUN 7 5* <5*  ?CREATININE 0.55 0.41* 0.36*  ?CALCIUM 8.6* 8.5* 8.2*  ? ?GFR: ?Estimated Creatinine Clearance: 67.3 mL/min (A) (by C-G formula based on SCr of 0.36 mg/dL (L)). ?Liver Function Tests: ?Recent Labs  ?Lab 10/23/2021 ?1942  ?AST 16  ?ALT 23  ?ALKPHOS 71  ?BILITOT 0.8  ?PROT 7.2  ?ALBUMIN 3.5  ? ?No results for input(s): LIPASE, AMYLASE in the last 168 hours. ?No results for input(s): AMMONIA in the last 168 hours. ?Coagulation Profile: ?Recent Labs  ?Lab 2021/10/23 ?1941  ?INR 1.0  ? ?Cardiac Enzymes: ?No results for input(s): CKTOTAL, CKMB,  CKMBINDEX, TROPONINI in the last 168 hours. ?BNP (last 3 results) ?No results for input(s): PROBNP in the last 8760 hours. ?HbA1C: ?No results for input(s): HGBA1C in the last 72 hours. ?CBG: ?No results for input(s): GLUCAP in the last 168 hours. ?Lipid Profile: ?No results for input(s): CHOL, HDL, LDLCALC, TRIG, CHOLHDL, LDLDIRECT in the last 72 hours. ?Thyroid Function Tests: ?No results for input(s): TSH, T4TOTAL, FREET4, T3FREE, THYROIDAB in the last 72 hours. ?Anemia Panel: ?No results for input(s): VITAMINB12, FOLATE, FERRITIN, TIBC, IRON, RETICCTPCT in the last 72 hours. ?Sepsis Labs: ?Recent Labs  ?Lab 2021-10-23 ?1941  ?LATICACIDVEN 0.8  ? ? ?Recent Results (from the past 240 hour(s))  ?  Resp Panel by RT-PCR (Flu A&B, Covid) Nasopharyngeal Swab     Status: None  ? Collection Time: 10/16/21  7:55 PM  ? Specimen: Nasopharyngeal Swab; Nasopharyngeal(NP) swabs in vial transport medium  ?Result Value Ref Range Status  ? SARS Coronavirus 2 by RT PCR NEGATIVE NEGATIVE Final  ?  Comment: (NOTE) ?SARS-CoV-2 target nucleic acids are NOT DETECTED. ? ?The SARS-CoV-2 RNA is generally detectable in upper respiratory ?specimens during the acute phase of infection. The lowest ?concentration of SARS-CoV-2 viral copies this assay can detect is ?138 copies/mL. A negative result does not preclude SARS-Cov-2 ?infection and should not be used as the sole basis for treatment or ?other patient management decisions. A negative result may occur with  ?improper specimen collection/handling, submission of specimen other ?than nasopharyngeal swab, presence of viral mutation(s) within the ?areas targeted by this assay, and inadequate number of viral ?copies(<138 copies/mL). A negative result must be combined with ?clinical observations, patient history, and epidemiological ?information. The expected result is Negative. ? ?Fact Sheet for Patients:  ?EntrepreneurPulse.com.au ? ?Fact Sheet for Healthcare Providers:   ?IncredibleEmployment.be ? ?This test is no t yet approved or cleared by the [place] FDA and  ?has been authorized for detection and/or diagnosis of SARS-CoV-2 by ?FDA under an Emergency Use Authoriz

## 2021-10-17 NOTE — Hospital Course (Addendum)
55 yof w/ history of tobacco abuse[ down to 1 pack q 4 d] abdominoplasty, who quit smoking a few days pta, developed upper respiratory tract symptoms with sinus congestion and cough for almost a month,  ?saw PCP Rx 10 days of antibiotics 3 days PTA ? SOB, DOE  cough with greenish sputum, came to the ED ?In ED febrile 101, hypoxic and needing supplemental oxygen chest x-ray with right middle lobe pneumonia  ?COVID-19 influenza negative .patient was placed on IV antibiotic and admitted for further management  ?

## 2021-10-17 NOTE — Progress Notes (Signed)
Initial Nutrition Assessment ? ?DOCUMENTATION CODES:  ? ?Not applicable ? ?INTERVENTION:  ?- will order Boost Breeze once/day, each supplement provides 250 kcal and 9 grams of protein. ?- will order Ensure Plus High Protein once/day, each supplement provides 350 kcal and 20 grams of protein. ?- will order 1 tablet Multivitamin with minerals daily ?- complete NFPE at follow-up. ? ? ?NUTRITION DIAGNOSIS:  ? ?Increased nutrient needs related to acute illness as evidenced by estimated needs. ? ?GOAL:  ? ?Patient will meet greater than or equal to 90% of their needs ? ?MONITOR:  ? ?PO intake, Supplement acceptance, Labs, Weight trends ? ?REASON FOR ASSESSMENT:  ? ?Malnutrition Screening Tool ? ?ASSESSMENT:  ? ?56 year-old female with medical history of tobacco abuse (she quit smoking a few days PTA) and abdominoplasty. She presented to the ED due to upper respiratory tract symptoms to include a cough and sinus congestion x1 month. She recently saw her PCP and was prescribed a 10 day course of abx which she finished 3 days PTA. Over that time she became increasingly short of breath, increased cough productive of greenish sputum, she felt feverish, and had chills. In the ED fever went up to 101 degrees and she was hypoxic. CXR showed R middle lobe PNA. She was started on abx and admitted for further management. ? ?Patient had breakfast this AM which provides 518 kcal and 24 grams protein.  ? ?She has not been seen by a [place] RD at any time in the past. ? ?Weight yesterday was 117 lb and weight on 3/4 was 122 lb. This indicates 5 lb weight loss (4% body weight) in the past 2 weeks. Weight on 2/6 was 125 lb. This indicates 8 lb weight loss (6.4% body weight) in the past 1.5 months. ? ?Weight had been stable 01/28/21-10/05/21. Noted IV fluids. Will monitor weight trends during hospitalization. ? ?Per notes: ?- CAP ?- seasonal allergies ?- possible undiagnosed COPD with plan for outpatient follow-up ? ?Labs reviewed; Na:  134 mmol/l, K: 3.4 mmol/l, BUN: <5 mg/dl, creatinine: 9.37 mg/dl, Ca: 8.2 mg/dl. ? ?Medications reviewed; 40 mg solu-medrol TID. ? ?IVF; NS @ 75 ml/hr. ?  ? ?NUTRITION - FOCUSED PHYSICAL EXAM: ? ?Unable to complete at this time.  ? ?Diet Order:   ?Diet Order   ? ?       ?  Diet regular Room service appropriate? Yes; Fluid consistency: Thin  Diet effective now       ?  ? ?  ?  ? ?  ? ? ?EDUCATION NEEDS:  ? ?No education needs have been identified at this time ? ?Skin:  Skin Assessment: Reviewed RN Assessment ? ?Last BM:  PTA/unknown ? ?Height:  ? ?Ht Readings from Last 1 Encounters:  ?10/16/21 5\' 4"  (1.626 m)  ? ? ?Weight:  ? ?Wt Readings from Last 1 Encounters:  ?10/16/21 53 kg  ? ? ? ?BMI:  Body mass index is 20.06 kg/m?. ? ?Estimated Nutritional Needs:  ?Kcal:  1700-1900 kcal ?Protein:  85-100 grams ?Fluid:  >/= 1.8 L/day ? ? ? ? ?10/18/21, MS, RD, LDN ?Registered Dietitian II ?Inpatient Clinical Nutrition ?RD pager # and on-call/weekend pager # available in AMION  ? ?

## 2021-10-17 NOTE — Plan of Care (Signed)
?  Problem: Activity: ?Goal: Ability to tolerate increased activity will improve ?Outcome: Progressing ?  ?Problem: Education: ?Goal: Knowledge of General Education information will improve ?Description: Including pain rating scale, medication(s)/side effects and non-pharmacologic comfort measures ?Outcome: Progressing ?  ?Problem: Elimination: ?Goal: Will not experience complications related to urinary retention ?Outcome: Progressing ?  ?

## 2021-10-18 ENCOUNTER — Telehealth: Payer: Self-pay | Admitting: Emergency Medicine

## 2021-10-18 LAB — LEGIONELLA PNEUMOPHILA SEROGP 1 UR AG: L. pneumophila Serogp 1 Ur Ag: NEGATIVE

## 2021-10-18 MED ORDER — POTASSIUM CHLORIDE CRYS ER 20 MEQ PO TBCR
20.0000 meq | EXTENDED_RELEASE_TABLET | Freq: Once | ORAL | Status: AC
  Administered 2021-10-18: 20 meq via ORAL
  Filled 2021-10-18: qty 1

## 2021-10-18 MED ORDER — METHYLPREDNISOLONE SODIUM SUCC 40 MG IJ SOLR
40.0000 mg | Freq: Two times a day (BID) | INTRAMUSCULAR | Status: DC
  Administered 2021-10-18 – 2021-10-19 (×2): 40 mg via INTRAVENOUS
  Filled 2021-10-18 (×2): qty 1

## 2021-10-18 MED ORDER — HYDROXYZINE HCL 10 MG PO TABS
10.0000 mg | ORAL_TABLET | Freq: Three times a day (TID) | ORAL | Status: DC | PRN
  Administered 2021-10-18 – 2021-10-19 (×4): 10 mg via ORAL
  Filled 2021-10-18 (×6): qty 1

## 2021-10-18 MED ORDER — NICOTINE 7 MG/24HR TD PT24
7.0000 mg | MEDICATED_PATCH | Freq: Every day | TRANSDERMAL | Status: DC
  Administered 2021-10-18 – 2021-10-20 (×3): 7 mg via TRANSDERMAL
  Filled 2021-10-18 (×3): qty 1

## 2021-10-18 NOTE — Progress Notes (Signed)
?PROGRESS NOTE ?[place]  ULA:[id number] DOB: 12-07-1965 DOA: 10/16/2021 ?PCP: Everrett Coombe, DO  ? ?Brief Narrative/Hospital Course: ?83 yof w/ history of tobacco abuse, abdominoplasty, who quit smoking a few days ago, has been having upper respiratory tract symptoms with sinus congestion and cough for almost a month, saw PCP  and completed prescribed 10 days of antibiotics 3 days PTA , for the last 3 days and becoming more short of breath with cough subjective feeling of fever and chills cough with greenish sputum, came to the ED ?In ED febrile 101, hypoxic and needing supplemental oxygen chest x-ray with right middle lobe pneumonia COVID-19 influenza negative .patient was placed on IV antibiotic and admitted for further management   ?  ?Subjective: ?Seen and examined this morning.  Patient reports he still not feeling well a lot better is still having cough, stuffy nose ?On 4 L nasal cannula ?Endorses she does get a lot of allergy symptoms during pollen season ? ?Assessment and Plan: ?Principal Problem: ?  CAP (community acquired pneumonia) ?Active Problems: ?  Nicotine dependence ?  Hyponatremia ?  Acute pulmonary insufficiency ?  ?CAP: xray with right middle lobe pneumonia, labs showed leukocytosis.  Patient w/ tobacco abuse recently quit, symptoms present for 1 month, had completed 10 days of oral antibiotics. ?Slowly improving.  Continue IV antibiotics, IV steroids- change 40mg  q8hr> q12hr, cont Claritin and Flonase antitussives, bronchodilators, supplemental oxygen. f/u urine antigen, sputum culture.  ? ?Seasonal allergy ?Subacute cough ?Bronchitis ?Longstanding smoking history-? COPD likely undiagnosed: ?Presentation multifactorial due to pneumonia also with seasonal allergy.  Continue with treatment as #1.Will need lung function/PFT as outpatient to diagnose COPD. ? ?Acute respiratory failure with hypoxia, pulse ox 84% at urgent care, 89% in the ED placed on 3 to 4 L nasal cannula.  Continue above  treatment and wean oxygen as tolerated. ? ?Nicotine dependence:Recently quit ?Hyponatremia: Resolved ?Mild hypokalemia replete orally. ? ?DVT prophylaxis: enoxaparin (LOVENOX) injection 40 mg Start: 10/17/21 1000 ?Code Status:   Code Status: Full Code ?Family Communication: plan of care discussed with patient/ at bedside. ? ?Disposition: Currently not medically stable for discharge. ?Status is: Inpatient ?Remains inpatient appropriate because: Ongoing management of pneumonia ?Objective: ?Vitals last 24 hrs: ?Vitals:  ? 10/17/21 1327 10/17/21 1948 10/18/21 0443 10/18/21 0928  ?BP: (!) 85/62 106/70 112/84   ?Pulse: 98 88 78   ?Resp: 20 16 16  (!) 22  ?Temp: 97.9 ?F (36.6 ?C) 99.2 ?F (37.3 ?C) 98 ?F (36.7 ?C)   ?TempSrc:  Oral Oral   ?SpO2: 95% 95% 97% 97%  ?Weight:      ?Height:      ? ?Weight change:  ? ?Physical Examination: ?General exam: AA0x3,older than stated age, weak appearing. ?HEENT:Oral mucosa moist, Ear/Nose WNL grossly, dentition normal. ?Respiratory system: bilaterally air entry present with improved wheezing, no use of accessory muscle ?Cardiovascular system: S1 & S2 +, No JVD,. ?Gastrointestinal system: Abdomen soft,NT,ND, BS+ ?Nervous System:Alert, awake, moving extremities and grossly nonfocal ?Extremities: edema neg,distal peripheral pulses palpable.  ?Skin: No rashes,no icterus. ?MSK: Normal muscle bulk,tone, power ? ? ?Medications reviewed:  ?Scheduled Meds: ? enoxaparin (LOVENOX) injection  40 mg Subcutaneous Q24H  ? feeding supplement  1 Container Oral Q24H  ? feeding supplement  237 mL Oral Q24H  ? fluticasone  1 spray Each Nare Daily  ? loratadine  10 mg Oral Daily  ? methylPREDNISolone (SOLU-MEDROL) injection  40 mg Intravenous Q8H  ? multivitamin with minerals  1 tablet Oral Daily  ? ?Continuous Infusions: ?  azithromycin 500 mg (10/17/21 2136)  ? cefTRIAXone (ROCEPHIN)  IV 2 g (10/18/21 0901)  ? ? ?  ?Diet Order   ? ?       ?  Diet regular Room service appropriate? Yes; Fluid consistency:  Thin  Diet effective now       ?  ? ?  ?  ? ?  ?  ? ?Nutrition Problem: Increased nutrient needs ?Etiology: acute illness ?Signs/Symptoms: estimated needs ?Interventions: Boost Breeze, [employer] (each supplement provides 350kcal and 20 grams of protein), MVI ? ? ?Intake/Output Summary (Last 24 hours) at 10/18/2021 1018 ?Last data filed at 10/17/2021 2136 ?Gross per 24 hour  ?Intake 1698.61 ml  ?Output --  ?Net 1698.61 ml  ? ? ?Net IO Since Admission: 3,185.52 mL [10/18/21 1018]  ?Wt Readings from Last 3 Encounters:  ?15-Nov-2021 53 kg  ?2021-11-15 53.1 kg  ?10/05/21 55.3 kg  ?  ? ?Unresulted Labs (From admission, onward)  ? ?  Start     Ordered  ? 10/23/21 0500  Creatinine, serum  (enoxaparin (LOVENOX)    CrCl >/= 30 ml/min)  Weekly,   R     ?Comments: while on enoxaparin therapy ?  ? 11/15/21 2341  ? 2021-11-15 2338  Legionella Pneumophila Serogp 1 Ur Ag  Once,   R       ? November 15, 2021 2341  ? ?  ?  ? ?  ?Data Reviewed: I have personally reviewed following labs and imaging studies ?CBC: ?Recent Labs  ?Lab 2021/11/15 ?1942 10/17/21 ?0006 10/17/21 ?4627  ?WBC 18.3* 16.4* 14.2*  ?NEUTROABS 15.4*  --  11.5*  ?HGB 13.5 13.8 12.4  ?HCT 38.9 42.4 38.3  ?MCV 92.8 97.0 96.5  ?PLT 241 242 223  ? ? ?Basic Metabolic Panel: ?Recent Labs  ?Lab November 15, 2021 ?1942 10/17/21 ?0006 10/17/21 ?0350  ?NA 127* 136 134*  ?K 3.5 3.6 3.4*  ?CL 91* 100 99  ?CO2 26 28 28   ?GLUCOSE 124* 121* 109*  ?BUN 7 5* <5*  ?CREATININE 0.55 0.41* 0.36*  ?CALCIUM 8.6* 8.5* 8.2*  ? ? ?GFR: ?Estimated Creatinine Clearance: 67.3 mL/min (A) (by C-G formula based on SCr of 0.36 mg/dL (L)). ?Liver Function Tests: ?Recent Labs  ?Lab 11-15-2021 ?1942  ?AST 16  ?ALT 23  ?ALKPHOS 71  ?BILITOT 0.8  ?PROT 7.2  ?ALBUMIN 3.5  ? ? ?No results for input(s): LIPASE, AMYLASE in the last 168 hours. ?No results for input(s): AMMONIA in the last 168 hours. ?Coagulation Profile: ?Recent Labs  ?Lab 11-15-2021 ?1942  ?INR 1.0  ? ? ?Cardiac Enzymes: ?No results for input(s): CKTOTAL, CKMB,  CKMBINDEX, TROPONINI in the last 168 hours. ?BNP (last 3 results) ?No results for input(s): PROBNP in the last 8760 hours. ?HbA1C: ?No results for input(s): HGBA1C in the last 72 hours. ?CBG: ?No results for input(s): GLUCAP in the last 168 hours. ?Lipid Profile: ?No results for input(s): CHOL, HDL, LDLCALC, TRIG, CHOLHDL, LDLDIRECT in the last 72 hours. ?Thyroid Function Tests: ?No results for input(s): TSH, T4TOTAL, FREET4, T3FREE, THYROIDAB in the last 72 hours. ?Anemia Panel: ?No results for input(s): VITAMINB12, FOLATE, FERRITIN, TIBC, IRON, RETICCTPCT in the last 72 hours. ?Sepsis Labs: ?Recent Labs  ?Lab 11-15-21 ?1942  ?LATICACIDVEN 0.8  ? ? ? ?Recent Results (from the past 240 hour(s))  ?Blood Culture (routine x 2)     Status: None (Preliminary result)  ? Collection Time: Nov 15, 2021  7:19 PM  ? Specimen: BLOOD  ?Result Value Ref Range Status  ? Specimen Description  Final  ?  BLOOD RIGHT ANTECUBITAL ?Performed at [facility] [place], [address] [place], [place] [address] ?  ? Special Requests   Final  ?  BOTTLES DRAWN AEROBIC AND ANAEROBIC Blood Culture adequate volume ?Performed at [facility] [place], [address] [place], [place] [address] ?  ? Culture   Final  ?  NO GROWTH 1 DAY ?Performed at [facility], [address] [place], [place] [address] ?  ? Report Status PENDING  Incomplete  ?Blood Culture (routine x 2)     Status: None (Preliminary result)  ? Collection Time: 10/16/21  7:24 PM  ? Specimen: BLOOD  ?Result Value Ref Range Status  ? Specimen Description   Final  ?  BLOOD LEFT ANTECUBITAL ?Performed at [facility] [place], [address] [place], [place] [address] ?  ? Special Requests   Final  ?  BOTTLES DRAWN AEROBIC AND ANAEROBIC Blood Culture adequate volume ?Performed at [facility] [place], [address] [place], [place] [address] ?  ? Culture   Final  ?  NO GROWTH 1 DAY ?Performed at [facility], [address] [place], [place] [address] ?  ?  Report Status PENDING  Incomplete  ?Resp Panel by RT-PCR (Flu A&B, Covid) Nasopharyngeal Swab     Status: None  ? Collection Time: 10/16/21  7:55 PM  ? Specimen: Nasopharyngeal Swab; Nasopharyngeal(NP) swabs in vial transpor

## 2021-10-18 NOTE — Telephone Encounter (Signed)
Pt's husband at  [employer] to request a work note. [place] is still in the hospital, but pt has missed 2 days of work. RN provided spouse w/ work excuse of 10/16/21 & also added pt's current hospital stay. Pt's husband verbalized an understanding that the return to work note would be provided when Roohi was discharged from the hospital ?

## 2021-10-19 LAB — BASIC METABOLIC PANEL
Anion gap: 9 (ref 5–15)
BUN: 14 mg/dL (ref 6–20)
CO2: 31 mmol/L (ref 22–32)
Calcium: 9.1 mg/dL (ref 8.9–10.3)
Chloride: 98 mmol/L (ref 98–111)
Creatinine, Ser: 0.45 mg/dL (ref 0.44–1.00)
GFR, Estimated: >60 mL/min (ref >60)
Glucose, Bld: 123 mg/dL — ABNORMAL HIGH (ref 70–99)
Potassium: 3.8 mmol/L (ref 3.5–5.1)
Sodium: 138 mmol/L (ref 135–145)

## 2021-10-19 LAB — CULTURE, RESPIRATORY W GRAM STAIN: Culture: NORMAL

## 2021-10-19 LAB — CBC
HCT: 39.2 % (ref 36.0–46.0)
Hemoglobin: 12.6 g/dL (ref 12.0–15.0)
MCH: 31 pg (ref 26.0–34.0)
MCHC: 32.1 g/dL (ref 30.0–36.0)
MCV: 96.6 fL (ref 80.0–100.0)
Platelets: 299 10*3/uL (ref 150–400)
RBC: 4.06 MIL/uL (ref 3.87–5.11)
RDW: 12.6 % (ref 11.5–15.5)
WBC: 10.4 10*3/uL (ref 4.0–10.5)
nRBC: 0 % (ref 0.0–0.2)

## 2021-10-19 MED ORDER — LEVOFLOXACIN 500 MG PO TABS
500.0000 mg | ORAL_TABLET | Freq: Every day | ORAL | Status: DC
  Administered 2021-10-20: 500 mg via ORAL
  Filled 2021-10-19: qty 1

## 2021-10-19 MED ORDER — AZITHROMYCIN 250 MG PO TABS: 500.0000 mg | ORAL_TABLET | Freq: Every day | ORAL | Status: DC

## 2021-10-19 MED ORDER — PREDNISONE 20 MG PO TABS
40.0000 mg | ORAL_TABLET | Freq: Every day | ORAL | Status: DC
  Administered 2021-10-20: 40 mg via ORAL
  Filled 2021-10-19: qty 2

## 2021-10-19 MED ORDER — NICOTINE 7 MG/24HR TD PT24: 7.0000 mg | MEDICATED_PATCH | Freq: Every day | TRANSDERMAL | Status: DC

## 2021-10-19 MED ORDER — BENZONATATE 100 MG PO CAPS
200.0000 mg | ORAL_CAPSULE | Freq: Three times a day (TID) | ORAL | Status: DC | PRN
  Administered 2021-10-19: 200 mg via ORAL
  Filled 2021-10-19: qty 2

## 2021-10-19 MED ORDER — BUPROPION HCL ER (SR) 150 MG PO TB12
150.0000 mg | ORAL_TABLET | Freq: Every day | ORAL | Status: DC
Start: 2021-10-19 — End: 2021-10-20
  Administered 2021-10-19 – 2021-10-20 (×2): 150 mg via ORAL
  Filled 2021-10-19 (×2): qty 1

## 2021-10-19 NOTE — Progress Notes (Signed)
?PROGRESS NOTE ?[place]  ALP:[id number] DOB: 1966-01-01 DOA: 10/16/2021 ?PCP: Everrett Coombe, DO  ? ?Brief Narrative/Hospital Course: ?78 yof w/ history of tobacco abuse[ down to 1 pack q 4 d] abdominoplasty, who quit smoking a few days pta, developed upper respiratory tract symptoms with sinus congestion and cough for almost a month,  ?saw PCP Rx 10 days of antibiotics 3 days PTA ? SOB, DOE  cough with greenish sputum, came to the ED ?In ED febrile 101, hypoxic and needing supplemental oxygen chest x-ray with right middle lobe pneumonia  ?COVID-19 influenza negative .patient was placed on IV antibiotic and admitted for further management   ?  ?Subjective: ?Feels better ?Less wheeze ?Not req oxygen [walked ~ 100 steps didn't desat ? ?Assessment and Plan: ?Principal Problem: ?  CAP (community acquired pneumonia) ?Active Problems: ?  Nicotine dependence ?  Hyponatremia ?  Acute pulmonary insufficiency ?  ?CAP: xray with right middle lobe pneumonia, labs showed leukocytosis.  Patient w/ tobacco abuse recently quit, symptoms present for 1 month, had completed 10 days of oral antibiotics. ?Change IV-->oral Levaquin ? ?Seasonal allergy ?Subacute cough ?Bronchitis ?Longstanding smoking history-? COPD likely undiagnosed: ?multifactorial due to pneumonia also with seasonal allergy.  Continue with treatment as #1. ?Will need lung function/PFT as outpatient to diagnose COPD. ?Solumned-->prednisone 40 qd, cont inhalers, robitussin ?Start zyban 150 x 10 d, the increase to 300 for another 5-7 days then STOP smoking ? [chantix helped but became too $$] ? ? ?Acute respiratory failure with hypoxia, pulse ox 84% at urgent care, 89% in the ED placed on 3 to 4 L nasal cannula.  Continue above treatment and wean oxygen as tolerated. ? ?Nicotine dependence:Recently quit-as above--give patch ?Hyponatremia: Resolved ?Mild hypokalemia replete orally. ? ?DVT prophylaxis: enoxaparin (LOVENOX) injection 40 mg Start: 10/17/21 1000 ?Code Status:    Code Status: Full Code ?Family Communication: plan of care discussed with patient/ at bedside. ? ?Disposition: Currently not medically stable for discharge. ?Status is: Inpatient ?Remains inpatient appropriate because: Ongoing management of pneumonia ?Objective: ?Vitals last 24 hrs: ?Vitals:  ? 10/19/21 0319 10/19/21 0501 10/19/21 1008 10/19/21 1329  ?BP:  114/76  99/75  ?Pulse:  70  95  ?Resp:  20  18  ?Temp:  97.6 ?F (36.4 ?C)  97.9 ?F (36.6 ?C)  ?TempSrc:    Oral  ?SpO2: 97% 98% 95% 95%  ?Weight:      ?Height:      ? ?Weight change:  ? ?Physical Examination: ? ?Awake pleasant no distress ?Eomi ncat  ?No wheeze decreased AE ?No rales ?Abd soft nt nd no rebound no guard ?No le edema ? ? ?Medications reviewed:  ?Scheduled Meds: ? buPROPion ER  150 mg Oral Daily  ? feeding supplement  1 Container Oral Q24H  ? feeding supplement  237 mL Oral Q24H  ? fluticasone  1 spray Each Nare Daily  ? levofloxacin  500 mg Oral Daily  ? loratadine  10 mg Oral Daily  ? multivitamin with minerals  1 tablet Oral Daily  ? nicotine  7 mg Transdermal Daily  ? [START ON 10/20/2021] predniSONE  40 mg Oral QAC breakfast  ? ?Continuous Infusions: ? ? ? ?  ?Diet Order   ? ?       ?  Diet regular Room service appropriate? Yes; Fluid consistency: Thin  Diet effective now       ?  ? ?  ?  ? ?  ?  ? ?Nutrition Problem: Increased nutrient needs ?Etiology: acute  illness ?Signs/Symptoms: estimated needs ?Interventions: Boost Breeze, [employer] (each supplement provides 350kcal and 20 grams of protein), MVI ? ? ?Intake/Output Summary (Last 24 hours) at 10/19/2021 1520 ?Last data filed at 10/19/2021 0900 ?Gross per 24 hour  ?Intake 2065.62 ml  ?Output --  ?Net 2065.62 ml  ? ? ?Net IO Since Admission: 5,731.14 mL [10/19/21 1520]  ?Wt Readings from Last 3 Encounters:  ?10-24-2021 53 kg  ?2021-10-24 53.1 kg  ?10/05/21 55.3 kg  ?  ? ?Unresulted Labs (From admission, onward)  ? ?  Start     Ordered  ? 10/23/21 0500  Creatinine, serum  (enoxaparin (LOVENOX)     CrCl >/= 30 ml/min)  Weekly,   R     ?Comments: while on enoxaparin therapy ?  ? 10/24/21 2341  ? ?  ?  ? ?  ?Data Reviewed: I have personally reviewed following labs and imaging studies ?CBC: ?Recent Labs  ?Lab 2021-10-24 ?1942 10/17/21 ?0006 10/17/21 ?5681 10/19/21 ?2751  ?WBC 18.3* 16.4* 14.2* 10.4  ?NEUTROABS 15.4*  --  11.5*  --   ?HGB 13.5 13.8 12.4 12.6  ?HCT 38.9 42.4 38.3 39.2  ?MCV 92.8 97.0 96.5 96.6  ?PLT 241 242 223 299  ? ? ?Basic Metabolic Panel: ?Recent Labs  ?Lab 10/24/2021 ?1942 10/17/21 ?0006 10/17/21 ?7001 10/19/21 ?7494  ?NA 127* 136 134* 138  ?K 3.5 3.6 3.4* 3.8  ?CL 91* 100 99 98  ?CO2 26 28 28 31   ?GLUCOSE 124* 121* 109* 123*  ?BUN 7 5* <5* 14  ?CREATININE 0.55 0.41* 0.36* 0.45  ?CALCIUM 8.6* 8.5* 8.2* 9.1  ? ? ?GFR: ?Estimated Creatinine Clearance: 67.3 mL/min (by C-G formula based on SCr of 0.45 mg/dL). ?Liver Function Tests: ?Recent Labs  ?Lab 10/24/21 ?1942  ?AST 16  ?ALT 23  ?ALKPHOS 71  ?BILITOT 0.8  ?PROT 7.2  ?ALBUMIN 3.5  ? ? ?No results for input(s): LIPASE, AMYLASE in the last 168 hours. ?No results for input(s): AMMONIA in the last 168 hours. ?Coagulation Profile: ?Recent Labs  ?Lab 10/24/2021 ?1942  ?INR 1.0  ? ? ?Cardiac Enzymes: ?No results for input(s): CKTOTAL, CKMB, CKMBINDEX, TROPONINI in the last 168 hours. ?BNP (last 3 results) ?No results for input(s): PROBNP in the last 8760 hours. ?HbA1C: ?No results for input(s): HGBA1C in the last 72 hours. ?CBG: ?No results for input(s): GLUCAP in the last 168 hours. ?Lipid Profile: ?No results for input(s): CHOL, HDL, LDLCALC, TRIG, CHOLHDL, LDLDIRECT in the last 72 hours. ?Thyroid Function Tests: ?No results for input(s): TSH, T4TOTAL, FREET4, T3FREE, THYROIDAB in the last 72 hours. ?Anemia Panel: ?No results for input(s): VITAMINB12, FOLATE, FERRITIN, TIBC, IRON, RETICCTPCT in the last 72 hours. ?Sepsis Labs: ?Recent Labs  ?Lab 10/24/21 ?1942  ?LATICACIDVEN 0.8  ? ? ? ?Recent Results (from the past 240 hour(s))  ?Blood Culture  (routine x 2)     Status: None (Preliminary result)  ? Collection Time: Oct 24, 2021  7:19 PM  ? Specimen: BLOOD  ?Result Value Ref Range Status  ? Specimen Description   Final  ?  BLOOD RIGHT ANTECUBITAL ?Performed at [facility], [address] [place], [place] ?  ? Special Requests   Final  ?  BOTTLES DRAWN AEROBIC AND ANAEROBIC Blood Culture adequate volume ?Performed at [facility], [address], [place], [place] ?  ? Culture   Final  ?  NO GROWTH 2 DAYS ?Performed at [facility], [address]  [place], [place] [address] ?  ? Report Status PENDING  Incomplete  ?Blood Culture (routine x 2)     Status: None (Preliminary result)  ? Collection Time: 10/16/21  7:24 PM  ? Specimen: BLOOD  ?Result Value Ref Range Status  ? Specimen Description   Final  ?  BLOOD LEFT ANTECUBITAL ?Performed at [facility] [place], [address] [place], [place] [address] ?  ? Special Requests   Final  ?  BOTTLES DRAWN AEROBIC AND ANAEROBIC Blood Culture adequate volume ?Performed at [facility] [place], [address] [place], [place] [address] ?  ? Culture   Final  ?  NO GROWTH 2 DAYS ?Performed at [facility], [address] [place], [place] [address] ?  ? Report Status PENDING  Incomplete  ?Resp Panel by RT-PCR (Flu A&B, Covid) Nasopharyngeal Swab     Status: None  ? Collection Time: 10/16/21  7:55 PM  ? Specimen: Nasopharyngeal Swab; Nasopharyngeal(NP) swabs in vial transport medium  ?Result Value Ref Range Status  ? SARS Coronavirus 2 by RT PCR NEGATIVE NEGATIVE Final  ?  Comment: (NOTE) ?SARS-CoV-2 target nucleic acids are NOT DETECTED. ? ?The SARS-CoV-2 RNA is generally detectable in upper respiratory ?specimens during the acute phase of infection. The lowest ?concentration of SARS-CoV-2 viral copies this assay can detect is ?138 copies/mL. A negative result does not preclude SARS-Cov-2 ?infection and should not be used as the sole basis for treatment  or ?other patient management decisions. A negative result may occur with  ?improper specimen collection/handling, submission of specimen other ?than nasopharyngeal swab, presence of viral mutation(s) within the ?a

## 2021-10-20 ENCOUNTER — Encounter: Payer: Self-pay | Admitting: Family Medicine

## 2021-10-20 MED ORDER — BUPROPION HCL ER (SR) 150 MG PO TB12: ORAL_TABLET | ORAL | 0 refills | Status: DC

## 2021-10-20 MED ORDER — PREDNISONE 20 MG PO TABS: ORAL_TABLET | ORAL | 0 refills | Status: AC

## 2021-10-20 MED ORDER — HYDROCOD POLI-CHLORPHE POLI ER 10-8 MG/5ML PO SUER: 5.0000 mL | Freq: Every evening | ORAL | 0 refills | Status: DC | PRN

## 2021-10-20 MED ORDER — FLUTICASONE PROPIONATE 50 MCG/ACT NA SUSP
1.0000 | Freq: Every day | NASAL | 2 refills | Status: DC
Start: 2021-10-21 — End: 2021-11-15

## 2021-10-20 MED ORDER — DULERA 200-5 MCG/ACT IN AERO: 2.0000 | INHALATION_SPRAY | Freq: Every day | RESPIRATORY_TRACT | 1 refills | Status: DC

## 2021-10-20 NOTE — Progress Notes (Unsigned)
{  [id number]} 

## 2021-10-20 NOTE — Progress Notes (Signed)
SATURATION QUALIFICATIONS: (This note is used to comply with regulatory documentation for home oxygen)  Patient Saturations on Room Air at Rest = 94%  Patient Saturations on Room Air while Ambulating = 96%  

## 2021-10-20 NOTE — Plan of Care (Signed)

## 2021-10-20 NOTE — Progress Notes (Signed)
Patient given discharge, follow up, and medication instructions, verbalized understanding, IV and telemetry monitor removed, personal belongings with patient, family to transport home  

## 2021-10-21 ENCOUNTER — Telehealth: Payer: Self-pay | Admitting: General Practice

## 2021-10-21 NOTE — Telephone Encounter (Signed)
Transition Care Management Follow-up Telephone Call ?Date of discharge and from where: 10/20/21 from [facility] ?How have you been since you were released from the hospital? States she did have a rough night last night but overall doing ok. Patient advised to go back to the ER if she has chest pain, difficulty breathing or shortness of breath. She stated she does not have a pulse oximeter at home.  ?Any questions or concerns? No ? ?Items Reviewed: ?Did the pt receive and understand the discharge instructions provided? Yes  ?Medications obtained and verified? No  ?Other? No  ?Any new allergies since your discharge? No  ?Dietary orders reviewed? Yes ?Do you have support at home? No  ? ?Home Care and Equipment/Supplies: ?Were home health services ordered? no ? ?Functional Questionnaire: (I = Independent and D = Dependent) ?ADLs:  I ? ?Bathing/Dressing- I ? ?Meal Prep- I ? ?Eating- I ? ?Maintaining continence- I ? ?Transferring/Ambulation- I ? ?Managing Meds- I ? ?Follow up appointments reviewed: ? ?PCP Hospital f/u appt confirmed? Yes  Scheduled to see Dr. Ashley Royalty on 10/22/21. ?Specialist Hospital f/u appt confirmed? No   ?Are transportation arrangements needed? No  ?If their condition worsens, is the pt aware to call PCP or go to the Emergency Dept.? Yes ?Was the patient provided with contact information for the PCP's office or ED? Yes ?Was to pt encouraged to call back with questions or concerns? Yes  ?

## 2021-10-22 ENCOUNTER — Encounter: Payer: Self-pay | Admitting: Family Medicine

## 2021-10-22 ENCOUNTER — Ambulatory Visit (INDEPENDENT_AMBULATORY_CARE_PROVIDER_SITE_OTHER): Payer: PRIVATE HEALTH INSURANCE | Admitting: Family Medicine

## 2021-10-22 ENCOUNTER — Other Ambulatory Visit: Payer: Self-pay

## 2021-10-22 DIAGNOSIS — F17210 Nicotine dependence, cigarettes, uncomplicated: Secondary | ICD-10-CM | POA: Diagnosis not present

## 2021-10-22 DIAGNOSIS — J189 Pneumonia, unspecified organism: Secondary | ICD-10-CM

## 2021-10-22 LAB — CULTURE, BLOOD (ROUTINE X 2)
Culture: NO GROWTH
Culture: NO GROWTH
Special Requests: ADEQUATE
Special Requests: ADEQUATE

## 2021-10-22 MED ORDER — HYDROCOD POLI-CHLORPHE POLI ER 10-8 MG/5ML PO SUER: 5.0000 mL | Freq: Every evening | ORAL | 0 refills | Status: DC | PRN

## 2021-10-22 MED ORDER — FLUTICASONE-SALMETEROL 250-50 MCG/ACT IN AEPB: 1.0000 | INHALATION_SPRAY | Freq: Two times a day (BID) | RESPIRATORY_TRACT | 3 refills | Status: DC

## 2021-10-22 MED ORDER — ALBUTEROL SULFATE HFA 108 (90 BASE) MCG/ACT IN AERS
1.0000 | INHALATION_SPRAY | Freq: Four times a day (QID) | RESPIRATORY_TRACT | 0 refills | Status: DC | PRN
Start: 2021-10-22 — End: 2022-04-23

## 2021-10-22 NOTE — Assessment & Plan Note (Signed)
Recommend continuation of bupropion and cessation from smoking. ?

## 2021-10-22 NOTE — Progress Notes (Signed)
?Dana Khan - 56 y.o. female MRN [id number]  Date of birth: 12-Jul-1966 ? ?Subjective ?No chief complaint on file. ? ? ?HPI ?This is a 56 year old female here today for hospital follow-up.  Recently admitted from 10/16/2021 to 10/20/2021.  TOC call completed 10/21/2021.  She initially presented with acute respiratory failure with hypoxia, diagnosed with pneumonia.  She was treated with IV antibiotics and transition to oral Levaquin prior to discharge.  Initially needed supplemental oxygen however was able to wean from this fairly quickly.  She does report that she continues to have some cough and dyspnea.  She was unable to fill Dulera inhaler due to no insurance coverage on this.  She was prescribed bupropion to help with smoking cessation but is only taken this for 2 days so far.  She has not smoked since discharge.  She will need spirometry. ? ?ROS:  A comprehensive ROS was completed and negative except as noted per HPI ? ?No Known Allergies ? ?History reviewed. No pertinent past medical history. ? ?Past Surgical History:  ?Procedure Laterality Date  ? ABDOMINOPLASTY    ? AUGMENTATION MAMMAPLASTY Bilateral   ? PLACEMENT OF BREAST IMPLANTS    ? TUBAL LIGATION    ? ? ?Social History  ? ?Socioeconomic History  ? Marital status: Married  ?  Spouse name: Not on file  ? Number of children: Not on file  ? Years of education: Not on file  ? Highest education level: Not on file  ?Occupational History  ? Not on file  ?Tobacco Use  ? Smoking status: Every Day  ?  Packs/day: 0.40  ?  Years: 13.00  ?  Pack years: 5.20  ?  Types: Cigarettes  ? Smokeless tobacco: Never  ?Vaping Use  ? Vaping Use: Never used  ?Substance and Sexual Activity  ? Alcohol use: Yes  ?  Alcohol/week: 1.0 - 2.0 standard drink  ?  Types: 1 - 2 Standard drinks or equivalent per week  ? Drug use: Never  ? Sexual activity: Yes  ?Other Topics Concern  ? Not on file  ?Social History Narrative  ? Not on file  ? ?Social Determinants of Health  ? ?Financial Resource  Strain: Not on file  ?Food Insecurity: Not on file  ?Transportation Needs: Not on file  ?Physical Activity: Not on file  ?Stress: Not on file  ?Social Connections: Not on file  ? ? ?Family History  ?Problem Relation Age of Onset  ? Heart failure Mother   ? Diabetes Brother   ? Stroke Maternal Aunt   ? ? ?Health Maintenance  ?Topic Date Due  ? INFLUENZA VACCINE  11/01/2021 (Originally 03/04/2021)  ? Zoster Vaccines- Shingrix (1 of 2) 08/20/2022 (Originally 03/05/2017)  ? PAP SMEAR-Modifier  05/21/2023 (Originally 03/05/1988)  ? COLONOSCOPY (Pts 45-70yrs Insurance coverage will need to be confirmed)  05/21/2023 (Originally 03/05/2012)  ? Hepatitis C Screening  05/21/2023 (Originally 03/05/1985)  ? COVID-19 Vaccine (1) 06/06/2023 (Originally 09/06/1966)  ? MAMMOGRAM  02/14/2023  ? TETANUS/TDAP  12/07/2026  ? HIV Screening  Completed  ? HPV VACCINES  Aged Out  ? ? ? ?----------------------------------------------------------------------------------------------------------------------------------------------------------------------------------------------------------------- ?Physical Exam ?BP 112/78 (BP Location: Left Arm, Patient Position: Sitting, Cuff Size: Small)   Pulse (!) 104   Ht 5\' 4"  (1.626 m)   Wt 122 lb (55.3 kg)   SpO2 94%   BMI 20.94 kg/m?  ? ?Physical Exam ?Constitutional:   ?   Appearance: Normal appearance.  ?Eyes:  ?   General: No scleral icterus. ?Cardiovascular:  ?  Rate and Rhythm: Normal rate and regular rhythm.  ?Pulmonary:  ?   Effort: Pulmonary effort is normal.  ?   Breath sounds: Normal breath sounds.  ?Musculoskeletal:  ?   Cervical back: Neck supple.  ?Neurological:  ?   General: No focal deficit present.  ?   Mental Status: She is alert.  ?Psychiatric:     ?   Mood and Affect: Mood normal.     ?   Behavior: Behavior normal.   ? ? ?------------------------------------------------------------------------------------------------------------------------------------------------------------------------------------------------------------------- ?Assessment and Plan ? ?CAP (community acquired pneumonia) ?She has completed course of antibiotics.  Continues to have cough and dyspnea.  This is likely related to COPD.  It appears the Advair is on her formulary.  We will start this in place of [facility].  Continue albuterol as needed.  Tussionex renewed.  We will plan to repeat chest x-ray in 4 to 6 weeks ? ?Nicotine dependence ?Recommend continuation of bupropion and cessation from smoking. ? ? ?Meds ordered this encounter  ?Medications  ? albuterol (VENTOLIN HFA) 108 (90 Base) MCG/ACT inhaler  ?  Sig: Inhale 1-2 puffs into the lungs every 6 (six) hours as needed for wheezing or shortness of breath.  ?  Dispense:  18 g  ?  Refill:  0  ? chlorpheniramine-HYDROcodone (TUSSIONEX PENNKINETIC ER) 10-8 MG/5ML  ?  Sig: Take 5 mLs by mouth at bedtime as needed for cough.  ?  Dispense:  115 mL  ?  Refill:  0  ? fluticasone-salmeterol (ADVAIR DISKUS) 250-50 MCG/ACT AEPB  ?  Sig: Inhale 1 puff into the lungs in the morning and at bedtime.  ?  Dispense:  60 each  ?  Refill:  3  ? ? ?Return in about 6 weeks (around 12/03/2021) for PNA follow up. ? ? ? ?This visit occurred during the SARS-CoV-2 public health emergency.  Safety protocols were in place, including screening questions prior to the visit, additional usage of staff PPE, and extensive cleaning of exam room while observing appropriate contact time as indicated for disinfecting solutions.  ? ?

## 2021-10-22 NOTE — Discharge Summary (Signed)
?Physician Discharge Summary ?  ?Patient: Dana Khan MRN: [id number] DOB: 12-Dec-1965  ?Admit date:     10/16/2021  ?Discharge date: 10/20/2021  ?Discharge Physician: Rhetta Mura  ? ?PCP: Everrett Coombe, DO  ? ?Recommendations at discharge:  ? ?Please refer patient for spirometry ?Please follow-up with her regarding her use of Zyban and see if she is quit smoking ?Obtain routine screening mammogram/consider low-dose CT scan and other women's health care ? ?Discharge Diagnoses: ?Principal Problem: ?  CAP (community acquired pneumonia) ?Active Problems: ?  Nicotine dependence ?  Hyponatremia ?  Acute pulmonary insufficiency ? ?Resolved Problems: ?  * No resolved hospital problems. * ? ?Hospital Course: ?76 yof w/ history of tobacco abuse[ down to 1 pack q 4 d] abdominoplasty, who quit smoking a few days pta, developed upper respiratory tract symptoms with sinus congestion and cough for almost a month,  ?saw PCP Rx 10 days of antibiotics 3 days PTA ? SOB, DOE  cough with greenish sputum, came to the ED ?In ED febrile 101, hypoxic and needing supplemental oxygen chest x-ray with right middle lobe pneumonia  ?COVID-19 influenza negative .patient was placed on IV antibiotic and admitted for further management  ? ?Assessment and Plan: ? ?CAP: xray with right middle lobe pneumonia, labs showed leukocytosis.  Patient w/ tobacco abuse recently quit, symptoms present for 1 month, had completed 10 days of oral antibiotics. ?Change IV-->oral Levaquin complete therapy in the outpatient setting ?  ?Seasonal allergy ?Subacute cough ?Bronchitis ?Longstanding smoking history-? COPD likely undiagnosed: ?multifactorial due to pneumonia also with seasonal allergy.  Continue with treatment as #1. ?Will need lung function/PFT as outpatient to confirm underlying main lung disease process ?Solumned-->prednisone taper on discharge, cont inhalers, robitussin ?Start zyban 150 x 10 d, the increase to 300 for another 5-7 days then STOP  smoking ? [chantix helped but became too $$] ?  ?Acute respiratory failure with hypoxia, pulse ox 84% at urgent care, 89% in the ED placed on 3 to 4 L nasal cannula.  ?Patient did not require oxygen on discharge and was weaned off the same and was ambulating and doing fair ?Nicotine dependence:Recently quit-as above--give patch ?Hyponatremia: Resolved ?Mild hypokalemia replete orally. ? ? ? ?  ? ? ?Consultants:  ?Procedures performed:   ?Disposition: Home ?Diet recommendation:  ?Discharge Diet Orders (From admission, onward)  ? ?  Start     Ordered  ? 10/20/21 0000  Diet - low sodium heart healthy       ? 10/20/21 1108  ? 10/20/21 0000  Diet - low sodium heart healthy       ? 10/20/21 1108  ? ?  ?  ? ?  ? ?Regular diet ?DISCHARGE MEDICATION: ?Allergies as of 10/20/2021   ?No Known Allergies ?  ? ?  ?Medication List  ?  ? ?STOP taking these medications   ? ?chlorpheniramine-HYDROcodone 10-8 MG/5ML ?Commonly known as: Tussionex Pennkinetic ER ?  ?clarithromycin 250 MG tablet ?Commonly known as: BIAXIN ?  ?TYLENOL SINUS SEVERE PO ?  ? ?  ? ?TAKE these medications   ? ?azelastine 0.05 % ophthalmic solution ?Commonly known as: OPTIVAR ?Place 2 drops into both eyes 2 (two) times daily. ?  ?buPROPion 150 MG 12 hr tablet ?Commonly known as: WELLBUTRIN SR ?Take 1 tablet daily for 1 week, increase to 2 tablets daily for another week--then STOP and STOP smoking ?  ?DELSYM PO ?Take 15 mLs by mouth 2 (two) times daily as needed (cough). ?  ?fluticasone 50 MCG/ACT  nasal spray ?Commonly known as: FLONASE ?Place 1 spray into both nostrils daily. ?  ?multivitamin tablet ?Take 1 tablet by mouth daily. ?  ?predniSONE 20 MG tablet ?Commonly known as: DELTASONE ?Take 2 tablets (40 mg total) by mouth daily before breakfast for 3 days, THEN 1 tablet (20 mg total) daily before breakfast for 3 days, THEN 0.5 tablets (10 mg total) daily before breakfast for 3 days. ?Start taking on: October 21, 2021 ?What changed: See the new instructions. ?   ? ?  ? ? ?Discharge Exam: ?Ceasar MonsFiled Weights  ? 10/16/21 1851  ?Weight: 53 kg  ? ?Is awake coherent pleasant no distress ?Walked the hallways without oxygen requirement no chest pain ?ROM intact ?S1-S2 no murmur ?Abdomen soft no rebound no guarding ? ?Condition at discharge: good ? ?The results of significant diagnostics from this hospitalization (including imaging, microbiology, ancillary and laboratory) are listed below for reference.  ? ?Imaging Studies: ?DG Chest 2 View ? ?Result Date: 10/16/2021 ?CLINICAL DATA:  Cough and fever. EXAM: CHEST - 2 VIEW COMPARISON:  Chest x-ray 05/17/2021 FINDINGS: There are minimal patchy opacities in the right middle lobe. There is no pleural effusion or pneumothorax. Cardiomediastinal silhouette is within normal limits. No acute fractures are seen. IMPRESSION: 1. Patchy opacities in the right middle lobe worrisome for infection. Followup PA and lateral chest X-ray is recommended in 3-4 weeks following trial of antibiotic therapy to ensure resolution and exclude underlying malignancy. Electronically Signed   By: Darliss CheneyAmy  Guttmann M.D.   On: 10/16/2021 17:56   ? ?Microbiology: ?Results for orders placed or performed during the hospital encounter of 10/16/21  ?Blood Culture (routine x 2)     Status: None  ? Collection Time: 10/16/21  7:19 PM  ? Specimen: BLOOD  ?Result Value Ref Range Status  ? Specimen Description   Final  ?  BLOOD RIGHT ANTECUBITAL ?Performed at [facility] [place], [address] [place], [place] [address] ?  ? Special Requests   Final  ?  BOTTLES DRAWN AEROBIC AND ANAEROBIC Blood Culture adequate volume ?Performed at [facility] [place], [address] [place], [place] [address] ?  ? Culture   Final  ?  NO GROWTH 5 DAYS ?Performed at [facility], [address] [place], [place] [address] ?  ? Report Status 10/22/2021 FINAL  Final  ?Blood Culture (routine x 2)     Status: None  ? Collection Time: 10/16/21  7:24 PM  ? Specimen: BLOOD  ?Result Value Ref  Range Status  ? Specimen Description   Final  ?  BLOOD LEFT ANTECUBITAL ?Performed at [facility] [place], [address] [place], [place] [address] ?  ? Special Requests   Final  ?  BOTTLES DRAWN AEROBIC AND ANAEROBIC Blood Culture adequate volume ?Performed at [facility] [place], [address] [place], [place] [address] ?  ? Culture   Final  ?  NO GROWTH 5 DAYS ?Performed at [facility], [address] [place], [place] [address] ?  ? Report Status 10/22/2021 FINAL  Final  ?Resp Panel by RT-PCR (Flu A&B, Covid) Nasopharyngeal Swab     Status: None  ? Collection Time: 10/16/21  7:55 PM  ? Specimen: Nasopharyngeal Swab; Nasopharyngeal(NP) swabs in vial transport medium  ?Result Value Ref Range Status  ? SARS Coronavirus 2 by RT PCR NEGATIVE NEGATIVE Final  ?  Comment: (NOTE) ?SARS-CoV-2 target nucleic acids are NOT DETECTED. ? ?The SARS-CoV-2  RNA is generally detectable in upper respiratory ?specimens during the acute phase of infection. The lowest ?concentration of SARS-CoV-2 viral copies this assay can detect is ?138 copies/mL. A negative result does not preclude SARS-Cov-2 ?infection and should not be used as the sole basis for treatment or ?other patient management decisions. A negative result may occur with  ?improper specimen collection/handling, submission of specimen other ?than nasopharyngeal swab, presence of viral mutation(s) within the ?areas targeted by this assay, and inadequate number of viral ?copies(<138 copies/mL). A negative result must be combined with ?clinical observations, patient history, and epidemiological ?information. The expected result is Negative. ? ?Fact Sheet for Patients:  ?BloggerCourse.com ? ?Fact Sheet for Healthcare Providers:  ?SeriousBroker.it ? ?This test is no t yet approved or cleared by the [place] FDA and  ?has been authorized for detection and/or diagnosis of SARS-CoV-2 by ?FDA under an Emergency  Use Authorization (EUA). This EUA will remain  ?in effect (meaning this test can be used) for the duration of the ?COVID-19 declaration under Section 564(b)(1) of the Act, 21 ?U.S.C.section 360bbb-3(b)(1), unl

## 2021-10-22 NOTE — Patient Instructions (Signed)
Let's try advair to replace dulera.  ?Continue albuterol as needed.  ?Avoid smoking.  ?See me again in 6 weeks.  ?

## 2021-10-22 NOTE — Addendum Note (Signed)
Addended by: Mammie Lorenzo on: 10/22/2021 11:02 PM ? ? Modules accepted: Level of Service ? ?

## 2021-10-22 NOTE — Assessment & Plan Note (Signed)
She has completed course of antibiotics.  Continues to have cough and dyspnea.  This is likely related to COPD.  It appears the Advair is on her formulary.  We will start this in place of [facility].  Continue albuterol as needed.  Tussionex renewed.  We will plan to repeat chest x-ray in 4 to 6 weeks ?

## 2021-10-25 ENCOUNTER — Telehealth: Payer: Self-pay

## 2021-10-25 NOTE — Telephone Encounter (Signed)
Pt called requesting an advance work note for Monday. States she doesn't feel any better yet. Still feels run down, dizziness, and blurred vision.  ? ?Advised patient to contact Triage on Monday morning. Advance work notes are not written since we are unable to pre-determine how a person will be feeling 3 days in advance.  ? ?Pt expressed understanding.  ?

## 2021-10-28 ENCOUNTER — Encounter: Payer: Self-pay | Admitting: Family Medicine

## 2021-10-28 ENCOUNTER — Telehealth: Payer: Self-pay

## 2021-10-28 NOTE — Telephone Encounter (Signed)
Pt lvm stating she would like to be written out of work due to continued blurred vision, feeling rundown, and dizziness.  ? ?Sent to Dr. Ashley Royalty for consideration.  ?

## 2021-10-28 NOTE — Telephone Encounter (Signed)
Updated letter sent via Mychart

## 2021-11-15 ENCOUNTER — Other Ambulatory Visit: Payer: Self-pay

## 2021-11-15 MED ORDER — BUPROPION HCL ER (SR) 150 MG PO TB12: ORAL_TABLET | ORAL | 0 refills | Status: DC

## 2021-11-15 MED ORDER — FLUTICASONE PROPIONATE 50 MCG/ACT NA SUSP
1.0000 | Freq: Every day | NASAL | 0 refills | Status: DC
Start: 2021-11-15 — End: 2021-11-15

## 2021-11-15 MED ORDER — FLUTICASONE PROPIONATE 50 MCG/ACT NA SUSP
1.0000 | Freq: Every day | NASAL | 2 refills | Status: DC
Start: 2021-11-15 — End: 2023-03-11

## 2021-11-16 ENCOUNTER — Other Ambulatory Visit: Payer: Self-pay | Admitting: Family Medicine

## 2021-12-03 ENCOUNTER — Ambulatory Visit (INDEPENDENT_AMBULATORY_CARE_PROVIDER_SITE_OTHER): Payer: PRIVATE HEALTH INSURANCE

## 2021-12-03 ENCOUNTER — Encounter: Payer: Self-pay | Admitting: Family Medicine

## 2021-12-03 ENCOUNTER — Ambulatory Visit (INDEPENDENT_AMBULATORY_CARE_PROVIDER_SITE_OTHER): Payer: PRIVATE HEALTH INSURANCE | Admitting: Family Medicine

## 2021-12-03 VITALS — BP 145/82 | HR 90 | Ht 64.0 in | Wt 129.0 lb

## 2021-12-03 DIAGNOSIS — F17210 Nicotine dependence, cigarettes, uncomplicated: Secondary | ICD-10-CM

## 2021-12-03 DIAGNOSIS — J189 Pneumonia, unspecified organism: Secondary | ICD-10-CM

## 2021-12-03 DIAGNOSIS — K1370 Unspecified lesions of oral mucosa: Secondary | ICD-10-CM

## 2021-12-03 NOTE — Progress Notes (Signed)
?Dana Khan - 56 y.o. female MRN [id number]  Date of birth: September 10, 1965 ? ?Subjective ?Chief Complaint  ?Patient presents with  ? Pneumonia  ? ? ?HPI ?[name] is a 56 year old female here today for follow-up of community-acquired pneumonia.  She reports she is doing much better at this point.  She was able to quit smoking for a brief period of time however has restarted again.  She does have some dyspnea at times.  Denies chest pain, increased cough or wheezing.  She does continue on daily Advair with albuterol as needed. ? ?Her dentist did note a lesion along the roof of her mouth.  Recommend that she have biopsy however told her that her dental insurance would not cover this.  She does not have any pain related to this and notes that it has been present for several months. ? ?ROS:  A comprehensive ROS was completed and negative except as noted per HPI ? ?No Known Allergies ? ?History reviewed. No pertinent past medical history. ? ?Past Surgical History:  ?Procedure Laterality Date  ? ABDOMINOPLASTY    ? AUGMENTATION MAMMAPLASTY Bilateral   ? PLACEMENT OF BREAST IMPLANTS    ? TUBAL LIGATION    ? ? ?Social History  ? ?Socioeconomic History  ? Marital status: Married  ?  Spouse name: Not on file  ? Number of children: Not on file  ? Years of education: Not on file  ? Highest education level: Not on file  ?Occupational History  ? Not on file  ?Tobacco Use  ? Smoking status: Every Day  ?  Packs/day: 0.40  ?  Years: 13.00  ?  Pack years: 5.20  ?  Types: Cigarettes  ? Smokeless tobacco: Never  ?Vaping Use  ? Vaping Use: Never used  ?Substance and Sexual Activity  ? Alcohol use: Yes  ?  Alcohol/week: 1.0 - 2.0 standard drink  ?  Types: 1 - 2 Standard drinks or equivalent per week  ? Drug use: Never  ? Sexual activity: Yes  ?Other Topics Concern  ? Not on file  ?Social History Narrative  ? Not on file  ? ?Social Determinants of Health  ? ?Financial Resource Strain: Not on file  ?Food Insecurity: Not on file  ?Transportation  Needs: Not on file  ?Physical Activity: Not on file  ?Stress: Not on file  ?Social Connections: Not on file  ? ? ?Family History  ?Problem Relation Age of Onset  ? Heart failure Mother   ? Diabetes Brother   ? Stroke Maternal Aunt   ? ? ?Health Maintenance  ?Topic Date Due  ? Zoster Vaccines- Shingrix (1 of 2) 08/20/2022 (Originally 03/05/2017)  ? PAP SMEAR-Modifier  05/21/2023 (Originally 03/05/1988)  ? COLONOSCOPY (Pts 45-25yrs Insurance coverage will need to be confirmed)  05/21/2023 (Originally 03/05/2012)  ? Hepatitis C Screening  05/21/2023 (Originally 03/05/1985)  ? COVID-19 Vaccine (1) 06/06/2023 (Originally 09[birth year]06/1967)  ? INFLUENZA VACCINE  03/04/2022  ? MAMMOGRAM  02/14/2023  ? TETANUS/TDAP  12/07/2026  ? HIV Screening  Completed  ? HPV VACCINES  Aged Out  ? ? ? ?----------------------------------------------------------------------------------------------------------------------------------------------------------------------------------------------------------------- ?Physical Exam ?BP (!) 145/82 (BP Location: Left Arm, Patient Position: Sitting, Cuff Size: Small)   Pulse 90   Ht 5\' 4"  (1.626 m)   Wt 129 lb (58.5 kg)   SpO2 95%   BMI 22.14 kg/m?  ? ?Physical Exam ?Constitutional:   ?   Appearance: Normal appearance.  ?Eyes:  ?   General: No scleral icterus. ?Cardiovascular:  ?  Rate and Rhythm: Normal rate and regular rhythm.  ?Pulmonary:  ?   Effort: Pulmonary effort is normal.  ?   Breath sounds: Normal breath sounds.  ?Musculoskeletal:  ?   Cervical back: Neck supple.  ?Neurological:  ?   Mental Status: She is alert.  ?Psychiatric:     ?   Mood and Affect: Mood normal.     ?   Behavior: Behavior normal.  ? ? ?------------------------------------------------------------------------------------------------------------------------------------------------------------------------------------------------------------------- ?Assessment and Plan ? ?CAP (community acquired pneumonia) ?Symptoms have essentially  resolved at this point.  I do suspect she probably has some underlying COPD and she will continue on Advair daily with albuterol as needed.  Repeating chest x-ray today to be sure this is fully resolved. ? ?Nicotine dependence ?Counseled on smoking cessation.  She will continue on bupropion ? ?Oral lesion ?Referral placed to oral surgeon to discuss biopsy as it may be less expensive for her to have this completed with her medical insurance. ? ? ?No orders of the defined types were placed in this encounter. ? ? ?No follow-ups on file. ? ? ? ?This visit occurred during the SARS-CoV-2 public health emergency.  Safety protocols were in place, including screening questions prior to the visit, additional usage of staff PPE, and extensive cleaning of exam room while observing appropriate contact time as indicated for disinfecting solutions.  ? ?

## 2021-12-03 NOTE — Patient Instructions (Addendum)
We'll be in touch with xray results.  ?Continue to work on cutting back on smoking.  ?You will be contacted to set up appt with Oral Surgeon.  ?

## 2021-12-03 NOTE — Assessment & Plan Note (Signed)
Counseled on smoking cessation.  She will continue on bupropion ?

## 2021-12-03 NOTE — Assessment & Plan Note (Signed)
Referral placed to oral surgeon to discuss biopsy as it may be less expensive for her to have this completed with her medical insurance. ?

## 2021-12-03 NOTE — Assessment & Plan Note (Signed)
Symptoms have essentially resolved at this point.  I do suspect she probably has some underlying COPD and she will continue on Advair daily with albuterol as needed.  Repeating chest x-ray today to be sure this is fully resolved. ?

## 2022-01-16 ENCOUNTER — Other Ambulatory Visit: Payer: Self-pay | Admitting: Family Medicine

## 2022-01-16 DIAGNOSIS — Z1231 Encounter for screening mammogram for malignant neoplasm of breast: Secondary | ICD-10-CM

## 2022-02-19 ENCOUNTER — Ambulatory Visit (INDEPENDENT_AMBULATORY_CARE_PROVIDER_SITE_OTHER): Payer: PRIVATE HEALTH INSURANCE

## 2022-02-19 DIAGNOSIS — Z1231 Encounter for screening mammogram for malignant neoplasm of breast: Secondary | ICD-10-CM | POA: Diagnosis not present

## 2022-04-23 ENCOUNTER — Ambulatory Visit
Admission: EM | Admit: 2022-04-23 | Discharge: 2022-04-23 | Disposition: A | Discharge status: Home / Self Care | Payer: PRIVATE HEALTH INSURANCE | Attending: Family Medicine | Admitting: Family Medicine

## 2022-04-23 DIAGNOSIS — J209 Acute bronchitis, unspecified: Secondary | ICD-10-CM | POA: Diagnosis not present

## 2022-04-23 DIAGNOSIS — J329 Chronic sinusitis, unspecified: Secondary | ICD-10-CM

## 2022-04-23 DIAGNOSIS — F17200 Nicotine dependence, unspecified, uncomplicated: Secondary | ICD-10-CM

## 2022-04-23 MED ORDER — PREDNISONE 20 MG PO TABS
40.0000 mg | ORAL_TABLET | Freq: Every day | ORAL | 0 refills | Status: DC
Start: 2022-04-23 — End: 2022-05-20

## 2022-04-23 MED ORDER — AMOXICILLIN-POT CLAVULANATE 875-125 MG PO TABS: 1.0000 | ORAL_TABLET | Freq: Two times a day (BID) | ORAL | 0 refills | Status: DC

## 2022-04-23 NOTE — ED Provider Notes (Signed)
[facility]    CSN: [id number] Arrival date & time: 04/23/22  1602      History   Chief Complaint Chief Complaint  Patient presents with   Cough   Nasal Congestion    HPI Dana Khan is a 56 y.o. female.   HPI Patient has had a cough for 2 weeks.  Reports runny nose stuffy nose postnasal drip.  She has been trying to work in spite of this.  Today she had a headache and felt dizzy, broke into a sweat.  Went to the plants nurse who evaluated and recommended urgent care visit.  States she had bilateral diminished breath sounds.  Patient is a known cigarette smoker.  No COVID testing is done, she has been sick for 2 weeks and just had worsening symptoms today. History reviewed. No pertinent past medical history.  Patient Active Problem List   Diagnosis Date Noted   Oral lesion 12/03/2021   Hyponatremia 10/17/2021   Acute pulmonary insufficiency 10/17/2021   CAP (community acquired pneumonia) 10/16/2021   Allergic conjunctivitis of both eyes 09/09/2021   Aortic atherosclerosis (HCC) 05/20/2021   Well adult exam 01/29/2021   Nicotine dependence 01/28/2021    Past Surgical History:  Procedure Laterality Date   ABDOMINOPLASTY     AUGMENTATION MAMMAPLASTY Bilateral    PLACEMENT OF BREAST IMPLANTS     TUBAL LIGATION      OB History   No obstetric history on file.      Home Medications    Prior to Admission medications   Medication Sig Start Date End Date Taking? Authorizing Provider  amoxicillin-clavulanate (AUGMENTIN) 875-125 MG tablet Take 1 tablet by mouth every 12 (twelve) hours. 04/23/22  Yes Eustace Moore, MD  predniSONE (DELTASONE) 20 MG tablet Take 2 tablets (40 mg total) by mouth daily with breakfast. 04/23/22  Yes Eustace Moore, MD  azelastine (OPTIVAR) 0.05 % ophthalmic solution Place 2 drops into both eyes 2 (two) times daily. 09/09/21   Breeback, Jade L, PA-C  fluticasone (FLONASE) 50 MCG/ACT nasal spray Place 1 spray into both nostrils  daily. 11/15/21   Everrett Coombe, DO  Multiple Vitamin (MULTIVITAMIN) tablet Take 1 tablet by mouth daily.    [provider]    Family History Family History  Problem Relation Age of Onset   Heart failure Mother    Diabetes Brother    Stroke Maternal Aunt     Social History Social History   Tobacco Use   Smoking status: Every Day    Packs/day: 0.40    Years: 13.00    Total pack years: 5.20    Types: Cigarettes   Smokeless tobacco: Never  Vaping Use   Vaping Use: Never used  Substance Use Topics   Alcohol use: Yes    Alcohol/week: 1.0 - 2.0 standard drink of alcohol    Types: 1 - 2 Standard drinks or equivalent per week   Drug use: Never     Allergies   Patient has no known allergies.   Review of Systems Review of Systems  See HPI Physical Exam Triage Vital Signs ED Triage Vitals  Enc Vitals Group     BP 04/23/22 1620 137/89     Pulse Rate 04/23/22 1620 82     Resp 04/23/22 1620 17     Temp 04/23/22 1620 98.6 F (37 C)     Temp Source 04/23/22 1620 Oral     SpO2 04/23/22 1620 97 %     Weight --  Height --      Head Circumference --      Peak Flow --      Pain Score 04/23/22 1622 0     Pain Loc --      Pain Edu? --      Excl. in [place]? --    No data found.  Updated Vital Signs BP 137/89 (BP Location: Right Arm)   Pulse 82   Temp 98.6 F (37 C) (Oral)   Resp 17   SpO2 97%      Physical Exam Constitutional:      General: She is not in acute distress.    Appearance: Normal appearance. She is well-developed.     Comments: Hoarse voice  HENT:     Head: Normocephalic and atraumatic.     Right Ear: Tympanic membrane and ear canal normal.     Left Ear: Tympanic membrane and ear canal normal.     Nose: Congestion and rhinorrhea present.     Mouth/Throat:     Pharynx: Posterior oropharyngeal erythema present.  Eyes:     Conjunctiva/sclera: Conjunctivae normal.     Pupils: Pupils are equal, round, and reactive to light.   Cardiovascular:     Rate and Rhythm: Normal rate and regular rhythm.     Heart sounds: Normal heart sounds.  Pulmonary:     Effort: Pulmonary effort is normal. No respiratory distress.     Breath sounds: Rhonchi present.  Abdominal:     General: There is no distension.     Palpations: Abdomen is soft.  Musculoskeletal:        General: Normal range of motion.     Cervical back: Normal range of motion.  Lymphadenopathy:     Cervical: No cervical adenopathy.  Skin:    General: Skin is warm and dry.  Neurological:     General: No focal deficit present.     Mental Status: She is alert.  Psychiatric:        Mood and Affect: Mood normal.        Behavior: Behavior normal.      UC Treatments / Results  Labs (all labs ordered are listed, but only abnormal results are displayed) Labs Reviewed - No data to display  EKG   Radiology No results found.  Procedures Procedures (including critical care time)  Medications Ordered in UC Medications - No data to display  Initial Impression / Assessment and Plan / UC Course  I have reviewed the triage vital signs and the nursing notes.  Pertinent labs & imaging results that were available during my care of the patient were reviewed by me and considered in my medical decision making (see chart for details).     Final Clinical Impressions(s) / UC Diagnoses   Final diagnoses:  Acute bronchitis, unspecified organism  Sinusitis, unspecified chronicity, unspecified location  Tobacco dependence     Discharge Instructions      Take prednisone once a day for 5 days Take antibiotic 2 times a day for 7 days Drink lots of water Continue Flonase for nasal and sinus congestion Take Mucinex DM, or Delsym if needed for cough See your doctor if not improving by next week    ED Prescriptions     Medication Sig Dispense Auth. Provider   predniSONE (DELTASONE) 20 MG tablet Take 2 tablets (40 mg total) by mouth daily with breakfast.  10 tablet Raylene Everts, MD   amoxicillin-clavulanate (AUGMENTIN) 875-125 MG tablet Take 1 tablet by mouth  every 12 (twelve) hours. 14 tablet Eustace Moore, MD      PDMP not reviewed this encounter.   Eustace Moore, MD 04/23/22 [id number]

## 2022-04-23 NOTE — Discharge Instructions (Signed)
Take prednisone once a day for 5 days Take antibiotic 2 times a day for 7 days Drink lots of water Continue Flonase for nasal and sinus congestion Take Mucinex DM, or Delsym if needed for cough See your doctor if not improving by next week

## 2022-04-23 NOTE — ED Triage Notes (Signed)
Pt c/o cough and runny nose x 2 weeks. Says at work today she felt hot and sweaty and got a headache/dizzy.  Hx of bronchitis and pneumonia. Flonase and Claritin or zyrtec prn.

## 2022-05-20 ENCOUNTER — Encounter: Payer: Self-pay | Admitting: Family Medicine

## 2022-05-20 ENCOUNTER — Ambulatory Visit (INDEPENDENT_AMBULATORY_CARE_PROVIDER_SITE_OTHER): Payer: PRIVATE HEALTH INSURANCE

## 2022-05-20 ENCOUNTER — Ambulatory Visit (INDEPENDENT_AMBULATORY_CARE_PROVIDER_SITE_OTHER): Payer: PRIVATE HEALTH INSURANCE | Admitting: Family Medicine

## 2022-05-20 VITALS — BP 156/94 | HR 85 | Ht 64.0 in | Wt 139.0 lb

## 2022-05-20 DIAGNOSIS — J441 Chronic obstructive pulmonary disease with (acute) exacerbation: Secondary | ICD-10-CM | POA: Diagnosis not present

## 2022-05-20 DIAGNOSIS — R051 Acute cough: Secondary | ICD-10-CM | POA: Diagnosis not present

## 2022-05-20 LAB — POCT INFLUENZA A/B
Influenza A, POC: NEGATIVE
Influenza B, POC: NEGATIVE

## 2022-05-20 LAB — POC COVID19 BINAXNOW: SARS Coronavirus 2 Ag: NEGATIVE

## 2022-05-20 MED ORDER — DOXYCYCLINE HYCLATE 100 MG PO TABS: 100.0000 mg | ORAL_TABLET | Freq: Two times a day (BID) | ORAL | 0 refills | Status: AC

## 2022-05-20 MED ORDER — TRELEGY ELLIPTA 100-62.5-25 MCG/ACT IN AEPB: 1.0000 | INHALATION_SPRAY | Freq: Every day | RESPIRATORY_TRACT | 2 refills | Status: DC

## 2022-05-20 MED ORDER — ALBUTEROL SULFATE HFA 108 (90 BASE) MCG/ACT IN AERS: 2.0000 | INHALATION_SPRAY | Freq: Four times a day (QID) | RESPIRATORY_TRACT | 11 refills | Status: DC | PRN

## 2022-05-20 MED ORDER — TRELEGY ELLIPTA 100-62.5-25 MCG/ACT IN AEPB: 1.0000 | INHALATION_SPRAY | Freq: Every day | RESPIRATORY_TRACT | 0 refills | Status: DC

## 2022-05-20 NOTE — Progress Notes (Signed)
Acute Office Visit  Subjective:     Patient ID: Dana Khan, female    DOB: Mar 01, 1966, 56 y.o.   MRN: [id number]  Chief Complaint  Patient presents with   Cough    HPI Patient is in today for not feeling better after being seen by urgent care for bronchitis. She was given prednisone for 5 days as well as Augmentin and diagnosed with a sinus infection. She was also told to take mucinex DM and Delsym. She is still noting symptoms of cold and cough.   Review of Systems  Constitutional:  Negative for chills and fever.  Respiratory:  Positive for cough. Negative for shortness of breath.   Cardiovascular:  Negative for chest pain.  Neurological:  Negative for headaches.        Objective:    BP (!) 156/94   Pulse 85   Ht 5\' 4"  (1.626 m)   Wt 139 lb (63 kg)   SpO2 96%   BMI 23.86 kg/m    Physical Exam Vitals and nursing note reviewed.  Constitutional:      General: She is not in acute distress.    Appearance: Normal appearance.  HENT:     Head: Normocephalic and atraumatic.     Right Ear: External ear normal.     Left Ear: External ear normal.     Nose: Nose normal.  Eyes:     Conjunctiva/sclera: Conjunctivae normal.  Cardiovascular:     Rate and Rhythm: Normal rate and regular rhythm.  Pulmonary:     Effort: Pulmonary effort is normal.     Breath sounds: Wheezing present.  Neurological:     General: No focal deficit present.     Mental Status: She is alert and oriented to person, place, and time.  Psychiatric:        Mood and Affect: Mood normal.        Behavior: Behavior normal.        Thought Content: Thought content normal.        Judgment: Judgment normal.     Results for orders placed or performed in visit on 05/20/22  POC COVID-19  Result Value Ref Range   SARS Coronavirus 2 Ag Negative Negative  POCT Influenza A/B  Result Value Ref Range   Influenza A, POC Negative Negative   Influenza B, POC Negative Negative        Assessment & Plan:    Problem List Items Addressed This Visit       Respiratory   COPD exacerbation (HCC)    - pt has suspected diagnosis of COPD and with increase in cough and sputum production likely this is a copd exacerbation. She tells me she has a childhood hx of asthma so this could be asthma related. Believe pt needs proper lung function testing once symptoms resolve - pt is not currently on an inhaler. Will go ahead and give her a sample to trelegy as well as call in trelegy for her. Will send in a rescue inhaler as well - will start doxycycline for exacerbation. She has already had steroids so I am hesitant to give more steroids  - wheezing heard on xray       Relevant Medications   doxycycline (VIBRA-TABS) 100 MG tablet   Fluticasone-Umeclidin-Vilant (TRELEGY ELLIPTA) 100-62.5-25 MCG/ACT AEPB   Fluticasone-Umeclidin-Vilant (TRELEGY ELLIPTA) 100-62.5-25 MCG/ACT AEPB   albuterol (VENTOLIN HFA) 108 (90 Base) MCG/ACT inhaler   Other Relevant Orders   DG Chest 2 View  Other   Acute cough - Primary    - covid tested and negative - flu tested and negative per my interpretation      Relevant Orders   POC COVID-19 (Completed)   POCT Influenza A/B (Completed)    Meds ordered this encounter  Medications   doxycycline (VIBRA-TABS) 100 MG tablet    Sig: Take 1 tablet (100 mg total) by mouth 2 (two) times daily for 7 days.    Dispense:  14 tablet    Refill:  0   Fluticasone-Umeclidin-Vilant (TRELEGY ELLIPTA) 100-62.5-25 MCG/ACT AEPB    Sig: Inhale 1 puff into the lungs daily.    Dispense:  1 each    Refill:  2   Fluticasone-Umeclidin-Vilant (TRELEGY ELLIPTA) 100-62.5-25 MCG/ACT AEPB    Sig: Inhale 1 puff into the lungs daily.    Dispense:  1 each    Refill:  0   albuterol (VENTOLIN HFA) 108 (90 Base) MCG/ACT inhaler    Sig: Inhale 2 puffs into the lungs every 6 (six) hours as needed for wheezing.    Dispense:  2 each    Refill:  11    Please use generic pro-air    Return in about 4  weeks (around 06/17/2022) for PCP for PFT referral.  Owens Loffler, DO

## 2022-05-20 NOTE — Assessment & Plan Note (Signed)
-   covid tested and negative - flu tested and negative per my interpretation

## 2022-05-20 NOTE — Assessment & Plan Note (Addendum)
-   pt has suspected diagnosis of COPD and with increase in cough and sputum production likely this is a copd exacerbation. She tells me she has a childhood hx of asthma so this could be asthma related. Believe pt needs proper lung function testing once symptoms resolve - pt is not currently on an inhaler. Will go ahead and give her a sample to trelegy as well as call in trelegy for her. Will send in a rescue inhaler as well - will start doxycycline for exacerbation. She has already had steroids so I am hesitant to give more steroids  - wheezing heard on xray

## 2022-06-25 IMAGING — DX DG CHEST 2V
2 series · 2 of 2 positions shown · non-contrast
Comparison: October 16, 2021

CLINICAL DATA: Follow-up pneumonia.

EXAM:
CHEST - 2 VIEW

[chest pa]
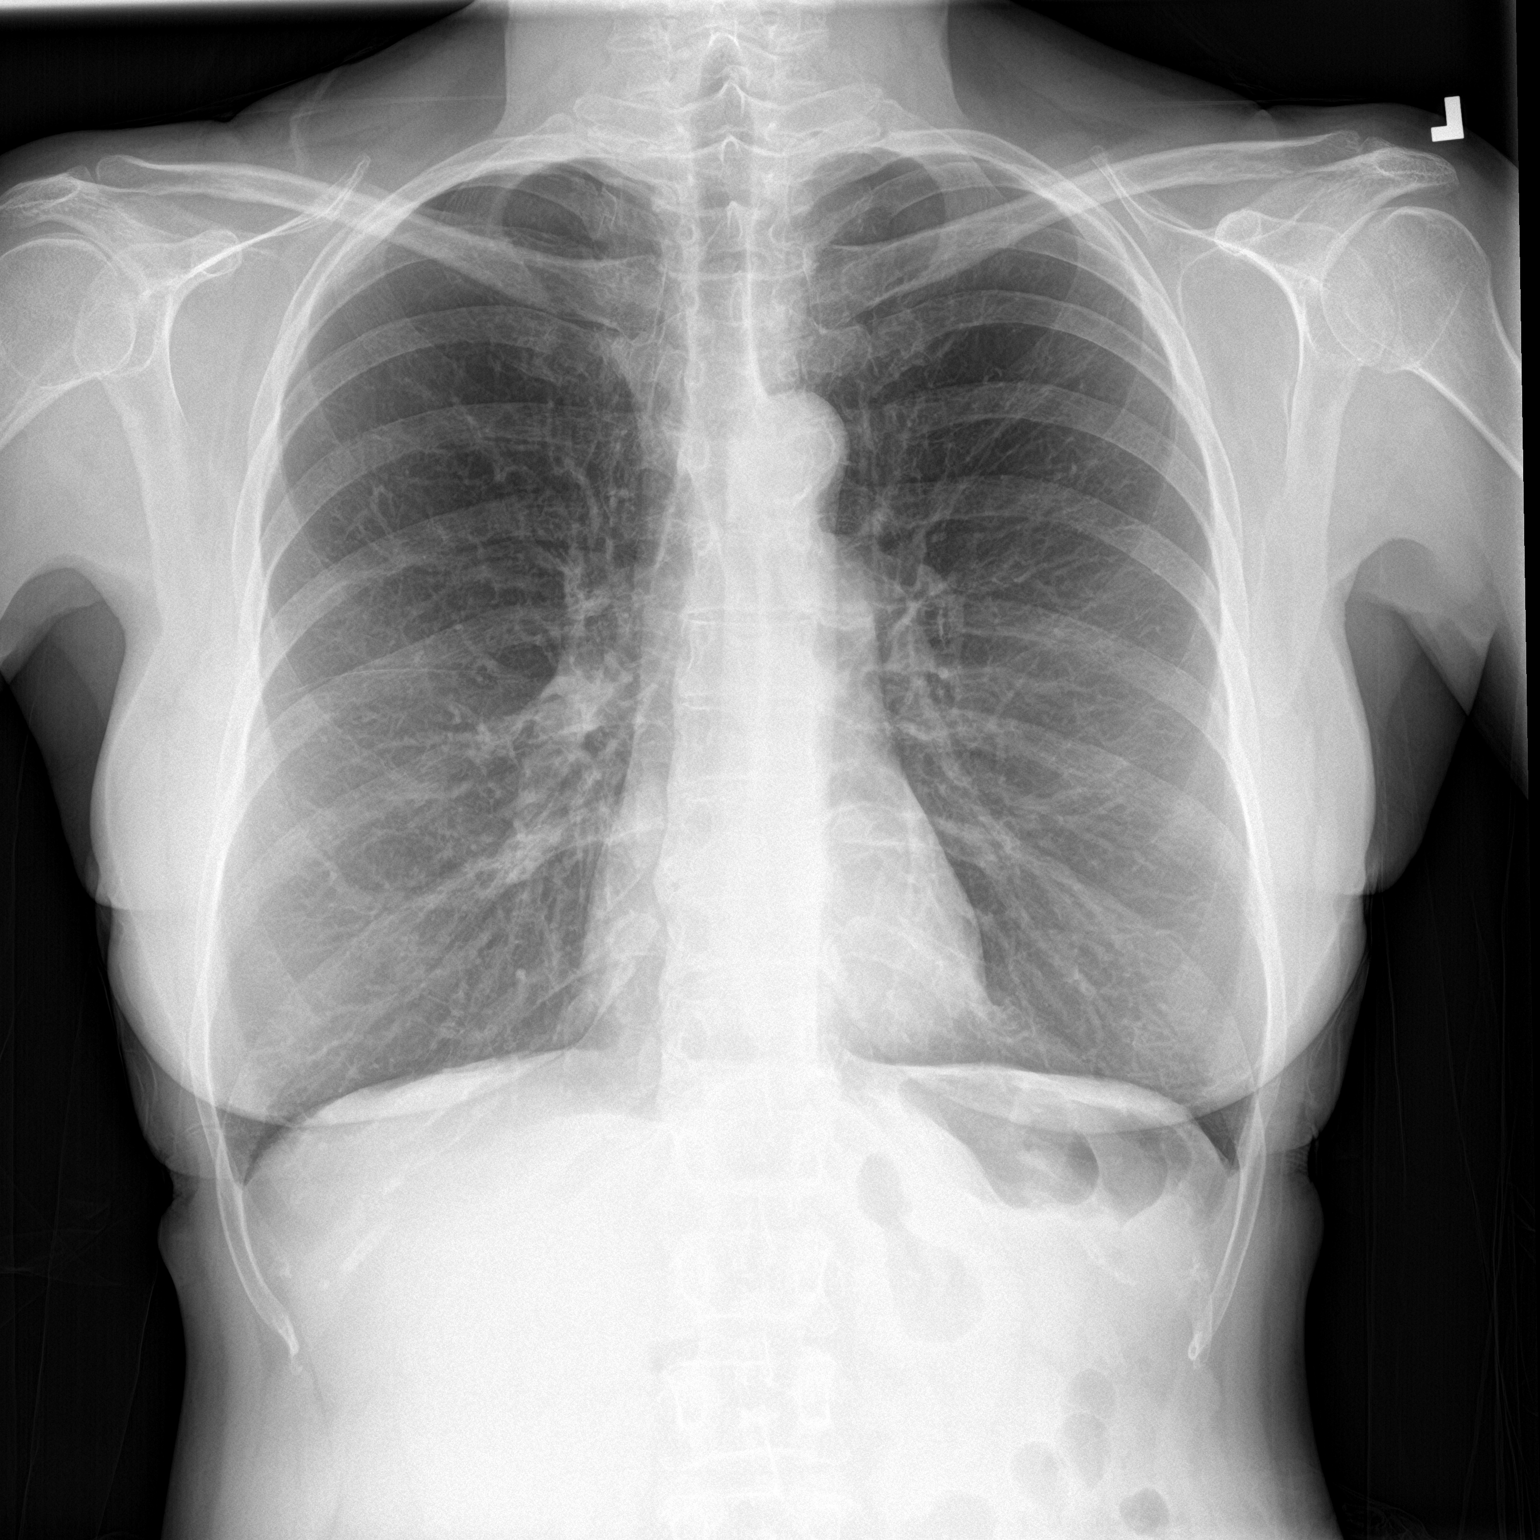

[chest lat]
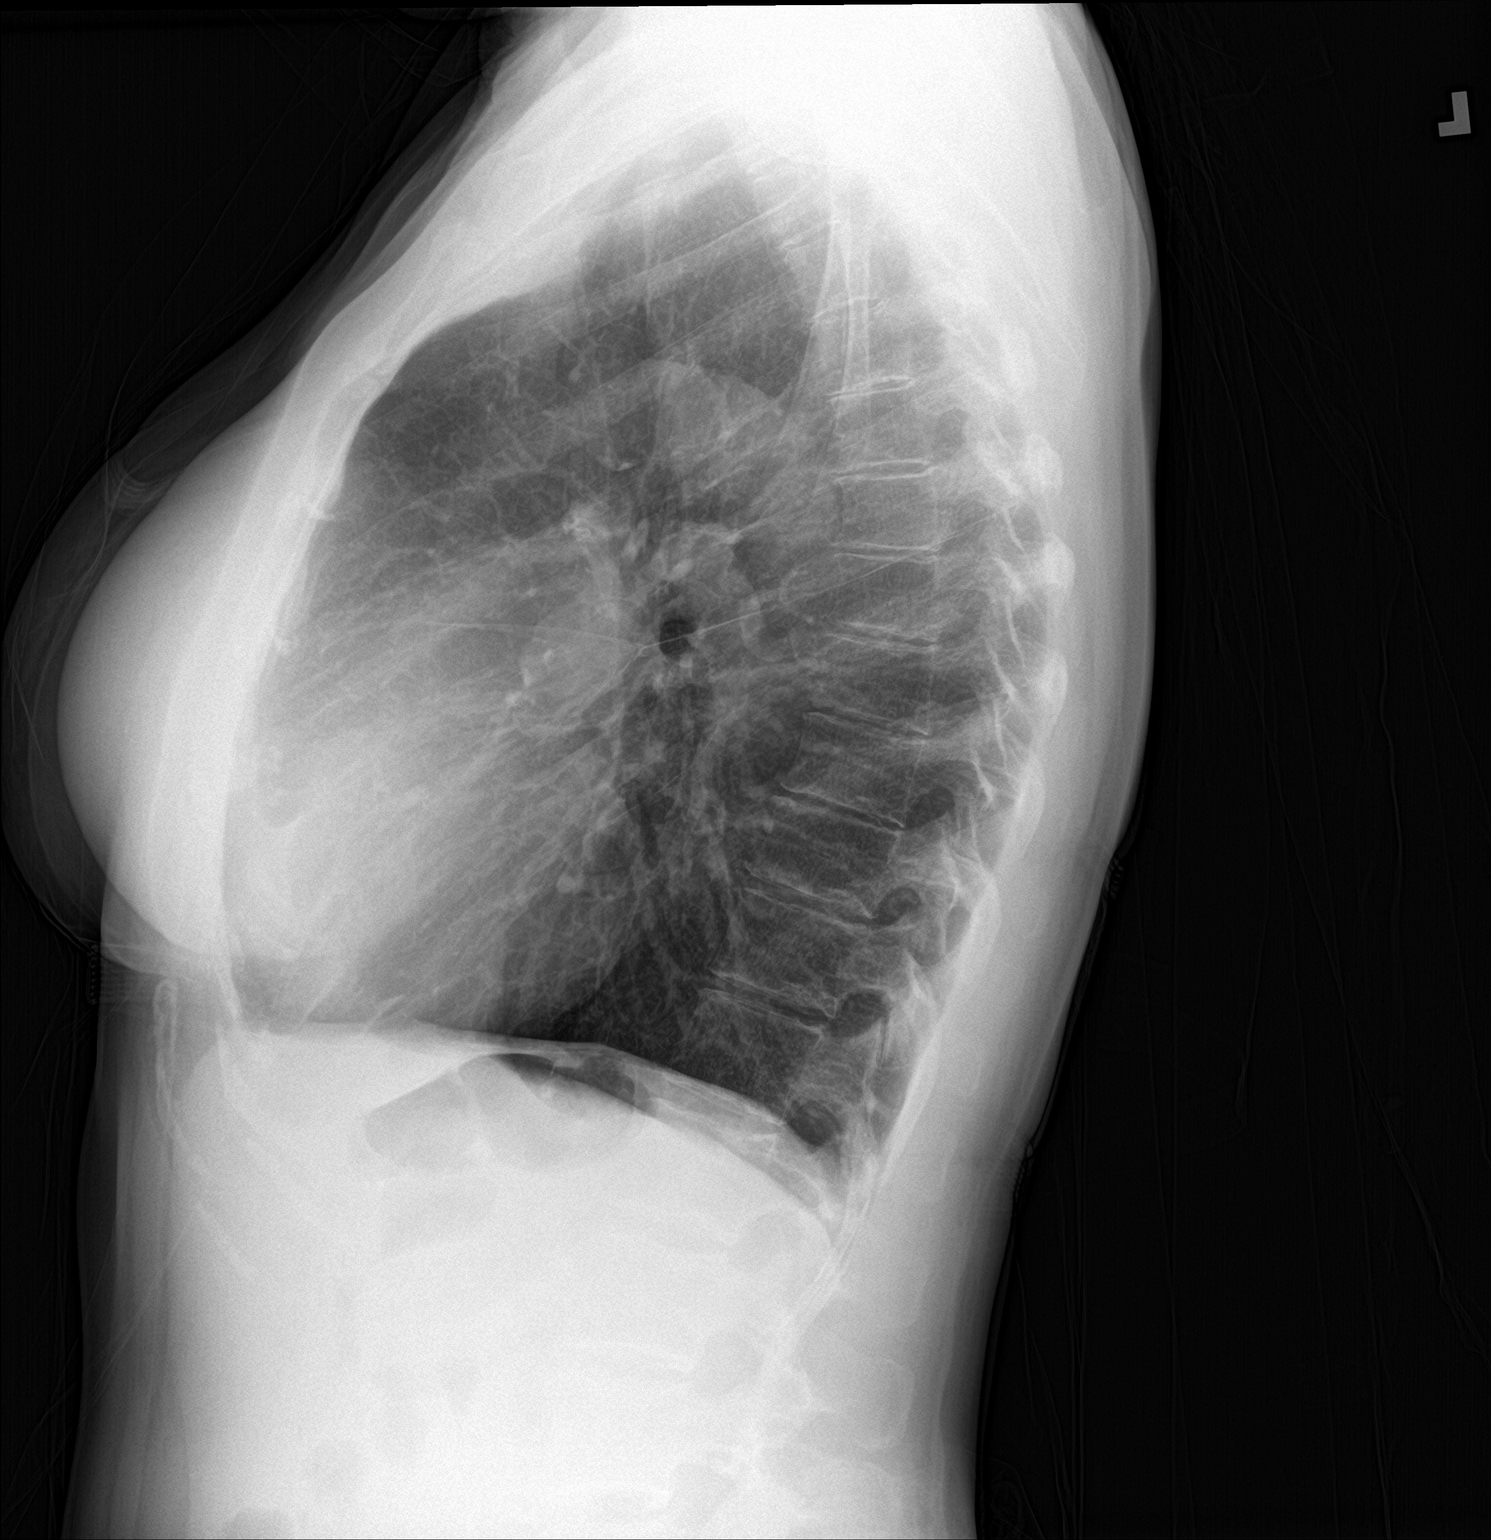

[2 of 2 positions shown; findings below may reference images not displayed]

FINDINGS: The heart size and mediastinal contours are within normal limits.
Both lungs are clear. Previously noted right lung infection has
resolved. The visualized skeletal structures are unremarkable.
IMPRESSION: No active cardiopulmonary disease.

## 2022-12-08 DIAGNOSIS — F419 Anxiety disorder, unspecified: Secondary | ICD-10-CM | POA: Insufficient documentation

## 2023-02-19 ENCOUNTER — Other Ambulatory Visit: Payer: Self-pay | Admitting: Family Medicine

## 2023-02-19 DIAGNOSIS — Z1231 Encounter for screening mammogram for malignant neoplasm of breast: Secondary | ICD-10-CM

## 2023-03-04 LAB — HM PAP SMEAR: HPV, high-risk: NEGATIVE

## 2023-03-11 ENCOUNTER — Ambulatory Visit (INDEPENDENT_AMBULATORY_CARE_PROVIDER_SITE_OTHER): Payer: PRIVATE HEALTH INSURANCE | Admitting: Family Medicine

## 2023-03-11 ENCOUNTER — Encounter: Payer: Self-pay | Admitting: Family Medicine

## 2023-03-11 VITALS — BP 131/86 | HR 83 | Ht 64.0 in | Wt 142.0 lb

## 2023-03-11 DIAGNOSIS — Z0000 Encounter for general adult medical examination without abnormal findings: Secondary | ICD-10-CM

## 2023-03-11 DIAGNOSIS — Z23 Encounter for immunization: Secondary | ICD-10-CM | POA: Diagnosis not present

## 2023-03-11 DIAGNOSIS — Z13220 Encounter for screening for lipoid disorders: Secondary | ICD-10-CM

## 2023-03-11 DIAGNOSIS — K5900 Constipation, unspecified: Secondary | ICD-10-CM

## 2023-03-11 DIAGNOSIS — Z1211 Encounter for screening for malignant neoplasm of colon: Secondary | ICD-10-CM

## 2023-03-11 DIAGNOSIS — J441 Chronic obstructive pulmonary disease with (acute) exacerbation: Secondary | ICD-10-CM | POA: Diagnosis not present

## 2023-03-11 DIAGNOSIS — J449 Chronic obstructive pulmonary disease, unspecified: Secondary | ICD-10-CM

## 2023-03-11 MED ORDER — PREDNISONE 50 MG PO TABS: ORAL_TABLET | ORAL | 0 refills | Status: DC

## 2023-03-11 MED ORDER — TRELEGY ELLIPTA 100-62.5-25 MCG/ACT IN AEPB
1.0000 | INHALATION_SPRAY | Freq: Every day | RESPIRATORY_TRACT | 2 refills | Status: AC
Start: 2023-03-11 — End: ?

## 2023-03-11 MED ORDER — FLUTICASONE PROPIONATE 50 MCG/ACT NA SUSP: 1.0000 | Freq: Every day | NASAL | 2 refills | Status: AC

## 2023-03-11 MED ORDER — ALBUTEROL SULFATE HFA 108 (90 BASE) MCG/ACT IN AERS: 2.0000 | INHALATION_SPRAY | Freq: Four times a day (QID) | RESPIRATORY_TRACT | 11 refills | Status: AC | PRN

## 2023-03-11 NOTE — Assessment & Plan Note (Signed)
5-day burst of prednisone added.  Restarting her Trelegy daily.  Albuterol as needed.  Encouraged to remain quit from smoking.

## 2023-03-11 NOTE — Progress Notes (Signed)
Towards the 4 x 1 there he had a window they have it we cannot really want it because we did not I went on there when they put 110 heart regular rate 10 confused Dana Khan - 57 y.o. female MRN [id number]  Date of birth: 1966-06-15  Subjective Chief Complaint  Patient presents with   Annual Exam    HPI Dana Khan is a 57 y.o. female here today for annual exam.  She has had some hoarseness as well as increased wheezing and dyspnea recently.  She has not had her regular inhalers for several weeks.  She has cut back quite a bit on her smoking and has not smoked anything for several days.  She is moderately active.  She feels her diet is pretty good.  Rare alcohol use.  She would like to have pneumococcal vaccination.  She has had some constipation.  She is due for colon cancer screening.    No Known Allergies  History reviewed. No pertinent past medical history.  Past Surgical History:  Procedure Laterality Date   ABDOMINOPLASTY     AUGMENTATION MAMMAPLASTY Bilateral    PLACEMENT OF BREAST IMPLANTS     TUBAL LIGATION      Social History   Socioeconomic History   Marital status: Married    Spouse name: Not on file   Number of children: Not on file   Years of education: Not on file   Highest education level: Not on file  Occupational History   Not on file  Tobacco Use   Smoking status: Every Day    Current packs/day: 0.40    Average packs/day: 0.4 packs/day for 13.0 years (5.2 ttl pk-yrs)    Types: Cigarettes   Smokeless tobacco: Never  Vaping Use   Vaping status: Never Used  Substance and Sexual Activity   Alcohol use: Yes    Alcohol/week: 1.0 - 2.0 standard drink of alcohol    Types: 1 - 2 Standard drinks or equivalent per week   Drug use: Never   Sexual activity: Yes  Other Topics Concern   Not on file  Social History Narrative   Not on file   Social Determinants of Health   Financial Resource Strain: Low Risk  (06/07/2019)   Received from [facility],  [facility]   Overall Financial Resource Strain (CARDIA)    Difficulty of Paying Living Expenses: Not hard at all  Food Insecurity: No Food Insecurity (06/07/2019)   Received from [facility], [facility]   Hunger Vital Sign    Worried About Running Out of Food in the Last Year: Never true    Ran Out of Food in the Last Year: Never true  Transportation Needs: No Transportation Needs (06/07/2019)   Received from [facility], [facility]   PRAPARE - Transportation    Lack of Transportation (Medical): No    Lack of Transportation (Non-Medical): No  Physical Activity: Sufficiently Active (06/07/2019)   Received from [facility], [facility]   Exercise Vital Sign    Days of Exercise per Week: 6 days    Minutes of Exercise per Session: 50 min  Stress: No Stress Concern Present (06/07/2019)   Received from [facility], [facility] Occupational [facility] - Occupational Stress Questionnaire    Feeling of Stress : Not at all  Social Connections: Unknown (12/13/2021)   Received from [facility], [facility]   Social Network    Social Network: Not on file    Family  History  Problem Relation Age of Onset   Heart failure Mother    Diabetes Brother    Stroke Maternal Aunt     Health Maintenance  Topic Date Due   PAP SMEAR-Modifier  05/21/2023 (Originally 03/05/1988)   Colonoscopy  05/21/2023 (Originally 03/05/2012)   Hepatitis C Screening  05/21/2023 (Originally 03/05/1985)   INFLUENZA VACCINE  06/11/2023 (Originally 03/05/2023)   COVID-19 Vaccine (1 - 2023-24 season) 06/11/2023 (Originally 04/04/2022)   Zoster Vaccines- Shingrix (1 of 2) 06/11/2023 (Originally 03/05/2017)   MAMMOGRAM  02/20/2024   DTaP/Tdap/Td (3 - Td or Tdap) 12/07/2026   HIV Screening  Completed   HPV VACCINES  Aged Out    Review of Systems  Constitutional:  Negative for chills, fever, malaise/fatigue and weight loss.  HENT:  Negative for congestion, ear pain and sore throat.   Eyes:   Negative for blurred vision, double vision and pain.  Respiratory:  Negative for cough and shortness of breath.   Cardiovascular:  Negative for chest pain and palpitations.  Gastrointestinal:  Negative for abdominal pain, blood in stool, constipation, heartburn and nausea.  Genitourinary:  Negative for dysuria and urgency.  Musculoskeletal:  Negative for joint pain and myalgias.  Neurological:  Negative for dizziness and headaches.  Endo/Heme/Allergies:  Does not bruise/bleed easily.  Psychiatric/Behavioral:  Negative for depression. The patient is not nervous/anxious and does not have insomnia.     ----------------------------------------------------------------------------------------------------------------------------------------------------------------------------------------------------------------- Physical Exam BP 131/86 (BP Location: Left Arm, Patient Position: Sitting, Cuff Size: Normal)   Pulse 83   Ht 5\' 4"  (1.626 m)   Wt 142 lb (64.4 kg)   SpO2 94%   BMI 24.37 kg/m   Physical Exam Constitutional:      General: She is not in acute distress. HENT:     Head: Normocephalic and atraumatic.     Right Ear: Tympanic membrane and ear canal normal.     Left Ear: Tympanic membrane and ear canal normal.     Nose: Nose normal.  Eyes:     General: No scleral icterus.    Conjunctiva/sclera: Conjunctivae normal.  Neck:     Thyroid: No thyromegaly.  Cardiovascular:     Rate and Rhythm: Normal rate and regular rhythm.     Heart sounds: Normal heart sounds.  Pulmonary:     Effort: Pulmonary effort is normal.     Breath sounds: Wheezing present.  Abdominal:     General: Bowel sounds are normal. There is no distension.     Palpations: Abdomen is soft.     Tenderness: There is no abdominal tenderness. There is no guarding.  Musculoskeletal:        General: Normal range of motion.     Cervical back: Normal range of motion and neck supple.  Lymphadenopathy:     Cervical: No  cervical adenopathy.  Skin:    General: Skin is warm and dry.     Findings: No rash.  Neurological:     General: No focal deficit present.     Mental Status: She is alert and oriented to person, place, and time.     Cranial Nerves: No cranial nerve deficit.     Coordination: Coordination normal.  Psychiatric:        Mood and Affect: Mood normal.        Behavior: Behavior normal.     ------------------------------------------------------------------------------------------------------------------------------------------------------------------------------------------------------------------- Assessment and Plan  Well adult exam Well adult Orders Placed This Encounter  Procedures   Pneumococcal conjugate vaccine 20-valent (Prevnar 20)   CMP14+EGFR  CBC with Differential   Lipid Panel With LDL/HDL Ratio   Ambulatory referral to Gastroenterology    Referral Priority:   Routine    Referral Type:   Consultation    Referral Reason:   Specialty Services Required    Number of Visits Requested:   1  Screenings: per lab orders Immunizations: Prevnar 20 Anticipatory guidance/risk factor reduction: Recommendations per AVS  COPD exacerbation (HCC) 5-day burst of prednisone added.  Restarting her Trelegy daily.  Albuterol as needed.  Encouraged to remain quit from smoking.   Meds ordered this encounter  Medications   predniSONE (DELTASONE) 50 MG tablet    Sig: Take 50mg  daily x5 days.    Dispense:  5 tablet    Refill:  0   albuterol (VENTOLIN HFA) 108 (90 Base) MCG/ACT inhaler    Sig: Inhale 2 puffs into the lungs every 6 (six) hours as needed for wheezing.    Dispense:  2 each    Refill:  11    Please use generic pro-air   fluticasone (FLONASE) 50 MCG/ACT nasal spray    Sig: Place 1 spray into both nostrils daily.    Dispense:  15.8 mL    Refill:  2   Fluticasone-Umeclidin-Vilant (TRELEGY ELLIPTA) 100-62.5-25 MCG/ACT AEPB    Sig: Inhale 1 puff into the lungs daily.     Dispense:  1 each    Refill:  2    No follow-ups on file.    This visit occurred during the SARS-CoV-2 public health emergency.  Safety protocols were in place, including screening questions prior to the visit, additional usage of staff PPE, and extensive cleaning of exam room while observing appropriate contact time as indicated for disinfecting solutions.

## 2023-03-11 NOTE — Patient Instructions (Addendum)
Restart Trelegy daily.  Use albuterol as needed.  Start prednisone for current hoarseness and UTI.  Have labs completed when fasting.    Preventive Care 56-56 Years Old, Female Preventive care refers to lifestyle choices and visits with your health care provider that can promote health and wellness. Preventive care visits are also called wellness exams. What can I expect for my preventive care visit? Counseling Your health care provider may ask you questions about your: Medical history, including: Past medical problems. Family medical history. Pregnancy history. Current health, including: Menstrual cycle. Method of birth control. Emotional well-being. Home life and relationship well-being. Sexual activity and sexual health. Lifestyle, including: Alcohol, nicotine or tobacco, and drug use. Access to firearms. Diet, exercise, and sleep habits. Work and work Astronomer. Sunscreen use. Safety issues such as seatbelt and bike helmet use. Physical exam Your health care provider will check your: Height and weight. These may be used to calculate your BMI (body mass index). BMI is a measurement that tells if you are at a healthy weight. Waist circumference. This measures the distance around your waistline. This measurement also tells if you are at a healthy weight and may help predict your risk of certain diseases, such as type 2 diabetes and high blood pressure. Heart rate and blood pressure. Body temperature. Skin for abnormal spots. What immunizations do I need?  Vaccines are usually given at various ages, according to a schedule. Your health care provider will recommend vaccines for you based on your age, medical history, and lifestyle or other factors, such as travel or where you work. What tests do I need? Screening Your health care provider may recommend screening tests for certain conditions. This may include: Lipid and cholesterol levels. Diabetes screening. This is done  by checking your blood sugar (glucose) after you have not eaten for a while (fasting). Pelvic exam and Pap test. Hepatitis B test. Hepatitis C test. HIV (human immunodeficiency virus) test. STI (sexually transmitted infection) testing, if you are at risk. Lung cancer screening. Colorectal cancer screening. Mammogram. Talk with your health care provider about when you should start having regular mammograms. This may depend on whether you have a family history of breast cancer. BRCA-related cancer screening. This may be done if you have a family history of breast, ovarian, tubal, or peritoneal cancers. Bone density scan. This is done to screen for osteoporosis. Talk with your health care provider about your test results, treatment options, and if necessary, the need for more tests. Follow these instructions at home: Eating and drinking  Eat a diet that includes fresh fruits and vegetables, whole grains, lean protein, and low-fat dairy products. Take vitamin and mineral supplements as recommended by your health care provider. Do not drink alcohol if: Your health care provider tells you not to drink. You are pregnant, may be pregnant, or are planning to become pregnant. If you drink alcohol: Limit how much you have to 0-1 drink a day. Know how much alcohol is in your drink. In the [place] one drink equals one 12 oz bottle of beer (355 mL), one 5 oz glass of wine (148 mL), or one 1 oz glass of hard liquor (44 mL). Lifestyle Brush your teeth every morning and night with fluoride toothpaste. Floss one time each day. Exercise for at least 30 minutes 5 or more days each week. Do not use any products that contain nicotine or tobacco. These products include cigarettes, chewing tobacco, and vaping devices, such as e-cigarettes. If you need help quitting, ask  your health care provider. Do not use drugs. If you are sexually active, practice safe sex. Use a condom or other form of protection to prevent  STIs. If you do not wish to become pregnant, use a form of birth control. If you plan to become pregnant, see your health care provider for a prepregnancy visit. Take aspirin only as told by your health care provider. Make sure that you understand how much to take and what form to take. Work with your health care provider to find out whether it is safe and beneficial for you to take aspirin daily. Find healthy ways to manage stress, such as: Meditation, yoga, or listening to music. Journaling. Talking to a trusted person. Spending time with friends and family. Minimize exposure to UV radiation to reduce your risk of skin cancer. Safety Always wear your seat belt while driving or riding in a vehicle. Do not drive: If you have been drinking alcohol. Do not ride with someone who has been drinking. When you are tired or distracted. While texting. If you have been using any mind-altering substances or drugs. Wear a helmet and other protective equipment during sports activities. If you have firearms in your house, make sure you follow all gun safety procedures. Seek help if you have been physically or sexually abused. What's next? Visit your health care provider once a year for an annual wellness visit. Ask your health care provider how often you should have your eyes and teeth checked. Stay up to date on all vaccines. This information is not intended to replace advice given to you by your health care provider. Make sure you discuss any questions you have with your health care provider. Document Revised: 01/16/2021 Document Reviewed: 01/16/2021 Elsevier Patient Education  2024 [employer].

## 2023-03-11 NOTE — Assessment & Plan Note (Addendum)
Well adult Orders Placed This Encounter  Procedures   Pneumococcal conjugate vaccine 20-valent (Prevnar 20)   CMP14+EGFR   CBC with Differential   Lipid Panel With LDL/HDL Ratio   Ambulatory referral to Gastroenterology    Referral Priority:   Routine    Referral Type:   Consultation    Referral Reason:   Specialty Services Required    Number of Visits Requested:   1  Screenings: per lab orders Immunizations: Prevnar 20 Anticipatory guidance/risk factor reduction: Recommendations per AVS

## 2023-03-20 LAB — CBC WITH DIFFERENTIAL/PLATELET: Hematocrit: 42.7 % (ref 34.0–46.6)

## 2023-03-26 ENCOUNTER — Telehealth: Payer: Self-pay | Admitting: Gastroenterology

## 2023-03-26 NOTE — Telephone Encounter (Signed)
Patient called to scheduled colonoscopy. Advised her referral is to [facility] and last had a colonoscopy in 2019 with [facility]. Stated her insurance only covers [employer] now. Advised we would have to have previous colonoscopy records for review prior to scheduling appointment. Patient states she will contact PCP due to needing records prior to scheduling.

## 2023-04-14 NOTE — Telephone Encounter (Signed)
Hi Dr. Barron Alvine,  Supervising Provider: 04/14/23-AM  Patient called requesting a transfer of care over to [facility] from [facility] as stated below. The patients records were obtained and scanned into Media for you to review and advise on scheduling.   Thank you

## 2023-04-15 ENCOUNTER — Ambulatory Visit: Payer: PRIVATE HEALTH INSURANCE

## 2023-04-15 DIAGNOSIS — Z1231 Encounter for screening mammogram for malignant neoplasm of breast: Secondary | ICD-10-CM | POA: Diagnosis not present

## 2023-04-21 DIAGNOSIS — N3000 Acute cystitis without hematuria: Secondary | ICD-10-CM | POA: Insufficient documentation

## 2023-04-22 ENCOUNTER — Ambulatory Visit (AMBULATORY_SURGERY_CENTER): Payer: PRIVATE HEALTH INSURANCE

## 2023-04-22 VITALS — Ht 64.0 in | Wt 142.0 lb

## 2023-04-22 DIAGNOSIS — Z1211 Encounter for screening for malignant neoplasm of colon: Secondary | ICD-10-CM

## 2023-04-22 MED ORDER — NA SULFATE-K SULFATE-MG SULF 17.5-3.13-1.6 GM/177ML PO SOLN
1.0000 | Freq: Once | ORAL | 0 refills | Status: AC
Start: 2023-04-22 — End: 2023-04-22

## 2023-04-22 NOTE — Progress Notes (Signed)
No egg or soy allergy known to patient  No issues known to pt with past sedation with any surgeries or procedures Patient denies ever being told they had issues or difficulty with intubation  No FH of Malignant Hyperthermia Pt is not on diet pills Pt is not on  home 02  Pt is not on blood thinners  Pt denies issues with constipation takes Miralax & magnesium and castor oil  No A fib or A flutter Have any cardiac testing pending--no Pt can ambulate independently Pt denies use of chewing tobacco Discussed diabetic I weight loss medication holds Discussed NSAID holds Checked BMI Pt instructed to use Singlecare.com or GoodRx for a price reduction on prep  Patient's chart reviewed by Cathlyn Parsons CNRA prior to previsit and patient appropriate for the [facility].  Pre visit completed and red dot placed by patient's name on their procedure day (on provider's schedule).

## 2023-04-27 ENCOUNTER — Encounter: Payer: Self-pay | Admitting: Gastroenterology

## 2023-05-04 ENCOUNTER — Encounter: Payer: Self-pay | Admitting: Gastroenterology

## 2023-05-04 ENCOUNTER — Ambulatory Visit: Payer: PRIVATE HEALTH INSURANCE | Admitting: Gastroenterology

## 2023-05-04 VITALS — BP 132/80 | HR 60 | Temp 98.9°F | Resp 20 | Ht 64.0 in | Wt 142.0 lb

## 2023-05-04 DIAGNOSIS — D125 Benign neoplasm of sigmoid colon: Secondary | ICD-10-CM | POA: Diagnosis not present

## 2023-05-04 DIAGNOSIS — D124 Benign neoplasm of descending colon: Secondary | ICD-10-CM | POA: Diagnosis not present

## 2023-05-04 DIAGNOSIS — K640 First degree hemorrhoids: Secondary | ICD-10-CM

## 2023-05-04 DIAGNOSIS — Z09 Encounter for follow-up examination after completed treatment for conditions other than malignant neoplasm: Secondary | ICD-10-CM

## 2023-05-04 DIAGNOSIS — Z86010 Personal history of colonic polyps: Secondary | ICD-10-CM | POA: Diagnosis not present

## 2023-05-04 DIAGNOSIS — K635 Polyp of colon: Secondary | ICD-10-CM | POA: Diagnosis not present

## 2023-05-04 DIAGNOSIS — D128 Benign neoplasm of rectum: Secondary | ICD-10-CM

## 2023-05-04 DIAGNOSIS — Z1211 Encounter for screening for malignant neoplasm of colon: Secondary | ICD-10-CM

## 2023-05-04 DIAGNOSIS — K621 Rectal polyp: Secondary | ICD-10-CM | POA: Diagnosis not present

## 2023-05-04 MED ORDER — SODIUM CHLORIDE 0.9 % IV SOLN: 500.0000 mL | Freq: Once | INTRAVENOUS | Status: DC

## 2023-05-04 NOTE — Progress Notes (Signed)
Pt's states no medical or surgical changes since previsit or office visit. 

## 2023-05-04 NOTE — Progress Notes (Signed)
GASTROENTEROLOGY PROCEDURE H&P NOTE   Primary Care Physician: Everrett Coombe, DO    Reason for Procedure:  Colon Cancer screening  Plan:    Colonoscopy  Patient is appropriate for endoscopic procedure(s) in the ambulatory ([facility]) setting.  The nature of the procedure, as well as the risks, benefits, and alternatives were carefully and thoroughly reviewed with the patient. Ample time for discussion and questions allowed. The patient understood, was satisfied, and agreed to proceed.     HPI: Dana Khan is a 57 y.o. female who presents for colonoscopy for ongoing colon polyp surveillance.  No active GI symptoms.  No known family history of colon cancer or related malignancy.  Patient is otherwise without complaints or active issues today.  Last colonoscopy 12/2017 and notable for a somewhat difficult procedure with adequate prep, along with 14 mm flat polyp in the ascending colon removed by hot snare polypectomy with cauterization of any residual tissue at polypectomy site.  10 mm flat polyp removed from transverse colon with hot snare.  Small grade 1 internal hemorrhoids.  Path was tubular adenoma for each and recommended repeat in 3 years.   Past Medical History:  Diagnosis Date   Anxiety    Asthma     Past Surgical History:  Procedure Laterality Date   ABDOMINOPLASTY     AUGMENTATION MAMMAPLASTY Bilateral    PLACEMENT OF BREAST IMPLANTS     TUBAL LIGATION      Prior to Admission medications   Medication Sig Start Date End Date Taking? Authorizing Provider  albuterol (VENTOLIN HFA) 108 (90 Base) MCG/ACT inhaler Inhale 2 puffs into the lungs every 6 (six) hours as needed for wheezing. 03/11/23  Yes Everrett Coombe, DO  escitalopram (LEXAPRO) 5 MG tablet Take 1 tablet by mouth daily. 04/01/23 06/30/23 Yes [provider]  fluticasone (FLONASE) 50 MCG/ACT nasal spray Place 1 spray into both nostrils daily. 03/11/23  Yes Everrett Coombe, DO  Fluticasone-Umeclidin-Vilant  (TRELEGY ELLIPTA) 100-62.5-25 MCG/ACT AEPB Inhale 1 puff into the lungs daily. 03/11/23  Yes Everrett Coombe, DO    Current Outpatient Medications  Medication Sig Dispense Refill   albuterol (VENTOLIN HFA) 108 (90 Base) MCG/ACT inhaler Inhale 2 puffs into the lungs every 6 (six) hours as needed for wheezing. 2 each 11   escitalopram (LEXAPRO) 5 MG tablet Take 1 tablet by mouth daily.     fluticasone (FLONASE) 50 MCG/ACT nasal spray Place 1 spray into both nostrils daily. 15.8 mL 2   Fluticasone-Umeclidin-Vilant (TRELEGY ELLIPTA) 100-62.5-25 MCG/ACT AEPB Inhale 1 puff into the lungs daily. 1 each 2   Current Facility-Administered Medications  Medication Dose Route Frequency Provider Last Rate Last Admin   0.9 %  sodium chloride infusion  500 mL Intravenous Once Shantasia Hunnell V, DO        Allergies as of 05/04/2023   (No Known Allergies)    Family History  Problem Relation Age of Onset   Heart failure Mother    Diabetes Brother    Stroke Maternal Aunt    Colon cancer Neg Hx    Colon polyps Neg Hx    Esophageal cancer Neg Hx    Rectal cancer Neg Hx    Stomach cancer Neg Hx     Social History   Socioeconomic History   Marital status: Married    Spouse name: Not on file   Number of children: Not on file   Years of education: Not on file   Highest education level: Not on file  Occupational  History   Not on file  Tobacco Use   Smoking status: Some Days    Current packs/day: 0.40    Average packs/day: 0.4 packs/day for 13.0 years (5.2 ttl pk-yrs)    Types: Cigarettes   Smokeless tobacco: Never  Vaping Use   Vaping status: Never Used  Substance and Sexual Activity   Alcohol use: Yes    Alcohol/week: 1.0 - 2.0 standard drink of alcohol    Types: 1 - 2 Standard drinks or equivalent per week   Drug use: Never   Sexual activity: Yes  Other Topics Concern   Not on file  Social History Narrative   Not on file   Social Determinants of Health   Financial Resource Strain:  Low Risk  (06/07/2019)   Received from [facility], [facility]   Overall Financial Resource Strain (CARDIA)    Difficulty of Paying Living Expenses: Not hard at all  Food Insecurity: No Food Insecurity (06/07/2019)   Received from [facility], [facility]   Hunger Vital Sign    Worried About Running Out of Food in the Last Year: Never true    Ran Out of Food in the Last Year: Never true  Transportation Needs: No Transportation Needs (06/07/2019)   Received from [facility], [facility]   PRAPARE - Transportation    Lack of Transportation (Medical): No    Lack of Transportation (Non-Medical): No  Physical Activity: Sufficiently Active (06/07/2019)   Received from [facility], [facility]   Exercise Vital Sign    Days of Exercise per Week: 6 days    Minutes of Exercise per Session: 50 min  Stress: No Stress Concern Present (06/07/2019)   Received from [facility], [facility] - Occupational Stress Questionnaire    Feeling of Stress : Not at all  Social Connections: Unknown (12/13/2021)   Received from [facility], [facility]   Social Network    Social Network: Not on file  Intimate Partner Violence: Unknown (11/05/2021)   Received from [facility], [facility]   HITS    Physically Hurt: Not on file    Insult or Talk Down To: Not on file    Threaten Physical Harm: Not on file    Scream or Curse: Not on file    Physical Exam: Vital signs in last 24 hours: @BP  (!) 151/84   Pulse 63   Temp 98.9 F (37.2 C) (Temporal)   Ht 5\' 4"  (1.626 m)   Wt 142 lb (64.4 kg)   SpO2 96%   BMI 24.37 kg/m  GEN: NAD EYE: Sclerae anicteric ENT: MMM CV: Non-tachycardic Pulm: CTA b/l GI: Soft, NT/ND NEURO:  Alert & Oriented x 3   Doristine Locks, DO [facility]   05/04/2023 3:06 PM

## 2023-05-04 NOTE — Progress Notes (Signed)
Pt resting comfortably. VSS. Airway intact. SBAR complete to RN. All questions answered.   

## 2023-05-04 NOTE — Op Note (Signed)
[facility] Patient Name: Antonie Stjacques Procedure Date: 05/04/2023 3:01 PM MRN: [id number] Endoscopist: Doristine Locks , MD, [id number] Age: 57 Referring MD:  Date of Birth: Oct 05, 1965 Gender: Female Account #: [id number] Procedure:                Colonoscopy Indications:              Surveillance: Personal history of adenomatous                            polyps on last colonoscopy 3 years ago                           Last colonoscopy 5/2019at outside facility and                            notable for a somewhat difficult procedure with                            adequate prep, along with 14 mm flat polyp in the                            ascending colon removed by hot snare polypectomy                            with cauterization of any residual tissue at                            polypectomy site. 10 mm flat polyp removed from                            transverse colon with hot snare. Small grade 1                            internal hemorrhoids. Path was tubular adenoma for                            each and recommended repeat in 3 years. Medicines:                Monitored Anesthesia Care Procedure:                Pre-Anesthesia Assessment:                           - Prior to the procedure, a History and Physical                            was performed, and patient medications and                            allergies were reviewed. The patient's tolerance of                            previous anesthesia was also reviewed. The risks  and benefits of the procedure and the sedation                            options and risks were discussed with the patient.                            All questions were answered, and informed consent                            was obtained. Prior Anticoagulants: The patient has                            taken no anticoagulant or antiplatelet agents. ASA                            Grade Assessment: II - A  patient with mild systemic                            disease. After reviewing the risks and benefits,                            the patient was deemed in satisfactory condition to                            undergo the procedure.                           After obtaining informed consent, the colonoscope                            was passed under direct vision. Throughout the                            procedure, the patient's blood pressure, pulse, and                            oxygen saturations were monitored continuously. The                            CF [id number] #[id number] was introduced through the anus                            and advanced to the the cecum, identified by                            appendiceal orifice and ileocecal valve. The                            colonoscopy was performed without difficulty. The                            patient tolerated the procedure well. Following  copious lavage, the quality of the bowel                            preparation was adequate. The ileocecal valve,                            appendiceal orifice, and rectum were photographed. Scope In: 3:15:49 PM Scope Out: 3:36:34 PM Scope Withdrawal Time: 0 hours 18 minutes 10 seconds  Total Procedure Duration: 0 hours 20 minutes 45 seconds  Findings:                 The perianal and digital rectal examinations were                            normal.                           Four sessile polyps were found in the sigmoid colon                            (3) and descending colon (1). The polyps were 3 to                            5 mm in size. These polyps were removed with a cold                            snare. Resection and retrieval were complete.                            Estimated blood loss was minimal.                           Six sessile polyps were found in the rectum and                            rectosigmoid colon. The polyps were 2 to 3 mm in                             size. These polyps were removed with a cold snare.                            Resection and retrieval were complete. Estimated                            blood loss was minimal.                           There was a small lipoma, in the ascending colon.                           Non-bleeding internal hemorrhoids were found during                            retroflexion. The hemorrhoids were small.  A moderate amount of fiercely adherent stool was                            found in the ascending colon and in the cecum.                            Lavage of the area was performed using copious                            amounts of sterile water, resulting in clearance                            with adequate visualization. Complications:            No immediate complications. Estimated Blood Loss:     Estimated blood loss was minimal. Impression:               - Four 3 to 5 mm polyps in the sigmoid colon and in                            the descending colon, removed with a cold snare.                            Resected and retrieved.                           - Six 2 to 3 mm polyps in the rectum, removed with                            a cold snare. Resected and retrieved.                           - Small lipoma in the ascending colon.                           - Non-bleeding internal hemorrhoids.                           - Stool in the ascending colon and in the cecum. Recommendation:           - Patient has a contact number available for                            emergencies. The signs and symptoms of potential                            delayed complications were discussed with the                            patient. Return to normal activities tomorrow.                            Written discharge instructions were provided to the  patient.                           - Resume previous diet.                            - Continue present medications.                           - Await pathology results.                           - Repeat colonoscopy in 3 years for surveillance.                           - Return to GI office PRN. Doristine Locks, MD 05/04/2023 3:43:47 PM

## 2023-05-04 NOTE — Patient Instructions (Signed)
YOU HAD AN ENDOSCOPIC PROCEDURE TODAY AT THE [facility]:   Refer to the procedure report that was given to you for any specific questions about what was found during the examination.  If the procedure report does not answer your questions, please call your gastroenterologist to clarify.  If you requested that your care partner not be given the details of your procedure findings, then the procedure report has been included in a sealed envelope for you to review at your convenience later.  YOU SHOULD EXPECT: Some feelings of bloating in the abdomen. Passage of more gas than usual.  Walking can help get rid of the air that was put into your GI tract during the procedure and reduce the bloating. If you had a lower endoscopy (such as a colonoscopy or flexible sigmoidoscopy) you may notice spotting of blood in your stool or on the toilet paper. If you underwent a bowel prep for your procedure, you may not have a normal bowel movement for a few days.  Please Note:  You might notice some irritation and congestion in your nose or some drainage.  This is from the oxygen used during your procedure.  There is no need for concern and it should clear up in a day or so.  SYMPTOMS TO REPORT IMMEDIATELY:  Following lower endoscopy (colonoscopy or flexible sigmoidoscopy):  Excessive amounts of blood in the stool  Significant tenderness or worsening of abdominal pains  Swelling of the abdomen that is new, acute  Fever of 100F or higher  For urgent or emergent issues, a gastroenterologist can be reached at any hour by calling [phone]. Do not use MyChart messaging for urgent concerns.    DIET:  We do recommend a small meal at first, but then you may proceed to your regular diet.  Drink plenty of fluids but you should avoid alcoholic beverages for 24 hours.  MEDICATIONS: Continue present medications.  FOLLOW UP: Await pathology results. Repeat colonoscopy in 3 years for surveillance. Return to  GI office as needed.  Please see handouts given to you by your recovery nurse: Polyps, Hemorrhoids.  Thank you for allowing [place] to provide for your healthcare needs today.  ACTIVITY:  You should plan to take it easy for the rest of today and you should NOT DRIVE or use heavy machinery until tomorrow (because of the sedation medicines used during the test).    FOLLOW UP: Our staff will call the number listed on your records the next business day following your procedure.  We will call around 7:15- 8:00 am to check on you and address any questions or concerns that you may have regarding the information given to you following your procedure. If we do not reach you, we will leave a message.     If any biopsies were taken you will be contacted by phone or by letter within the next 1-3 weeks.  Please call us at [phone] if you have not heard about the biopsies in 3 weeks.    SIGNATURES/CONFIDENTIALITY: You and/or your care partner have signed paperwork which will be entered into your electronic medical record.  These signatures attest to the fact that that the information above on your After Visit Summary has been reviewed and is understood.  Full responsibility of the confidentiality of this discharge information lies with you and/or your care-partner.

## 2023-05-05 ENCOUNTER — Telehealth: Payer: Self-pay | Admitting: *Deleted

## 2023-05-05 NOTE — Telephone Encounter (Signed)
  Follow up Call-     05/04/2023    2:21 PM  Call back number  Post procedure Call Back phone  # [phone]  Permission to leave phone message Yes     Patient questions:  Do you have a fever, pain , or abdominal swelling? No. Pain Score  0 *  Have you tolerated food without any problems? Yes.    Have you been able to return to your normal activities? Yes.    Do you have any questions about your discharge instructions: Diet   No. Medications  No. Follow up visit  No.  Do you have questions or concerns about your Care? No.  Actions: * If pain score is 4 or above: No action needed, pain <4.

## 2023-05-07 LAB — SURGICAL PATHOLOGY

## 2023-06-13 ENCOUNTER — Ambulatory Visit
Admission: EM | Admit: 2023-06-13 | Discharge: 2023-06-13 | Disposition: A | Discharge status: Home / Self Care | Payer: PRIVATE HEALTH INSURANCE | Attending: Family Medicine | Admitting: Family Medicine

## 2023-06-13 ENCOUNTER — Other Ambulatory Visit: Payer: Self-pay

## 2023-06-13 DIAGNOSIS — S301XXA Contusion of abdominal wall, initial encounter: Secondary | ICD-10-CM | POA: Diagnosis not present

## 2023-06-13 DIAGNOSIS — R1031 Right lower quadrant pain: Secondary | ICD-10-CM | POA: Diagnosis not present

## 2023-06-13 NOTE — ED Provider Notes (Signed)
[facility]    CSN: [id number] Arrival date & time: 06/13/23  1114      History   Chief Complaint Chief Complaint  Patient presents with   Coagulation Disorder    HPI Dana Khan is a 57 y.o. female.   HPI  Patient is here for what she feels is an abnormal bruise.  She states that she recently was treated with bronchitis, and had multiple severe coughing spells.  When she had the coughing spells she felt pain in her right groin.  She never noticed any swelling or bulge.  She states that this morning when she got in the shower she noticed a purple discoloration all across her lower abdomen and pubic area.  She feels like she might be having internal bleeding.  She would like to have this checked.  She is not having any frank bleeding, vaginal bleeding.  Does not usually have a history of easy bruisability.  Past Medical History:  Diagnosis Date   Anxiety    Asthma     Patient Active Problem List   Diagnosis Date Noted   Acute cystitis 04/21/2023   Anxiety 12/08/2022   COPD exacerbation (HCC) 05/20/2022   Acute cough 05/20/2022   Oral lesion 12/03/2021   Hyponatremia 10/17/2021   Acute pulmonary insufficiency 10/17/2021   CAP (community acquired pneumonia) 10/16/2021   Allergic conjunctivitis of both eyes 09/09/2021   Aortic atherosclerosis (HCC) 05/20/2021   Well adult exam 01/29/2021   Nicotine dependence 01/28/2021   Tobacco use disorder 09/01/2012    Past Surgical History:  Procedure Laterality Date   ABDOMINOPLASTY     AUGMENTATION MAMMAPLASTY Bilateral    PLACEMENT OF BREAST IMPLANTS     TUBAL LIGATION      OB History   No obstetric history on file.      Home Medications    Prior to Admission medications   Medication Sig Start Date End Date Taking? Authorizing Provider  benzonatate (TESSALON) 100 MG capsule Take 100 mg by mouth 3 (three) times daily as needed for cough.   Yes [provider]  albuterol (VENTOLIN HFA) 108 (90  Base) MCG/ACT inhaler Inhale 2 puffs into the lungs every 6 (six) hours as needed for wheezing. 03/11/23   Everrett Coombe, DO  escitalopram (LEXAPRO) 5 MG tablet Take 1 tablet by mouth daily. 04/01/23 06/30/23  [provider]  fluticasone (FLONASE) 50 MCG/ACT nasal spray Place 1 spray into both nostrils daily. 03/11/23   Everrett Coombe, DO  Fluticasone-Umeclidin-Vilant (TRELEGY ELLIPTA) 100-62.5-25 MCG/ACT AEPB Inhale 1 puff into the lungs daily. 03/11/23   Everrett Coombe, DO    Family History Family History  Problem Relation Age of Onset   Heart failure Mother    Diabetes Brother    Stroke Maternal Aunt    Colon cancer Neg Hx    Colon polyps Neg Hx    Esophageal cancer Neg Hx    Rectal cancer Neg Hx    Stomach cancer Neg Hx     Social History Social History   Tobacco Use   Smoking status: Some Days    Current packs/day: 0.40    Average packs/day: 0.4 packs/day for 13.0 years (5.2 ttl pk-yrs)    Types: Cigarettes   Smokeless tobacco: Never  Vaping Use   Vaping status: Never Used  Substance Use Topics   Alcohol use: Yes    Alcohol/week: 1.0 - 2.0 standard drink of alcohol    Types: 1 - 2 Standard drinks or equivalent per week  Drug use: Never     Allergies   Patient has no known allergies.   Review of Systems Review of Systems   Physical Exam Triage Vital Signs ED Triage Vitals  Encounter Vitals Group     BP 06/13/23 1121 (!) 149/87     Systolic BP Percentile --      Diastolic BP Percentile --      Pulse Rate 06/13/23 1121 96     Resp 06/13/23 1121 16     Temp 06/13/23 1121 97.6 F (36.4 C)     Temp src --      SpO2 06/13/23 1121 98 %     Weight --      Height --      Head Circumference --      Peak Flow --      Pain Score 06/13/23 1126 5     Pain Loc --      Pain Education --      Exclude from Growth Chart --    No data found.  Updated Vital Signs BP (!) 149/87   Pulse 96   Temp 97.6 F (36.4 C)   Resp 16   SpO2 98%   Visual  Acuity Right Eye Distance:   Left Eye Distance:   Bilateral Distance:    Right Eye Near:   Left Eye Near:    Bilateral Near:     Physical Exam Constitutional:      General: She is not in acute distress.    Appearance: She is well-developed and normal weight. She is not ill-appearing.  HENT:     Head: Normocephalic and atraumatic.  Eyes:     Conjunctiva/sclera: Conjunctivae normal.     Pupils: Pupils are equal, round, and reactive to light.  Cardiovascular:     Rate and Rhythm: Normal rate.  Pulmonary:     Effort: Pulmonary effort is normal. No respiratory distress.  Abdominal:     General: Abdomen is flat. A surgical scar is present. There is no distension.     Palpations: Abdomen is soft.       Comments: Abdominoplasty scar noted.   Musculoskeletal:        General: Normal range of motion.     Cervical back: Normal range of motion.  Skin:    General: Skin is warm and dry.  Neurological:     Mental Status: She is alert.      UC Treatments / Results  Labs (all labs ordered are listed, but only abnormal results are displayed) Labs Reviewed - No data to display  EKG   Radiology No results found.  Procedures Procedures (including critical care time)  Medications Ordered in UC Medications - No data to display  Initial Impression / Assessment and Plan / UC Course  I have reviewed the triage vital signs and the nursing notes.  Pertinent labs & imaging results that were available during my care of the patient were reviewed by me and considered in my medical decision making (see chart for details).     The area of tenderness could be an inguinal hernia that I cannot clearly identified.  I told her to see her PCP if this persists for additional testing and scan I feel like the extensive ecchymosis that is so alarming to the patient is probably just a broken blood vessel when she did all of her coughing.  It is nontender.  Final Clinical Impressions(s) / UC  Diagnoses   Final diagnoses:  Groin pain, right  Traumatic ecchymosis of groin, initial encounter   Discharge Instructions   None    ED Prescriptions   None    PDMP not reviewed this encounter.   Eustace Moore, MD 06/13/23 [phone]

## 2023-06-13 NOTE — ED Triage Notes (Signed)
Was taking a shower today and noticed what looked like bleeding to genital area. Earlier had pain to right groin.

## 2023-06-13 NOTE — Discharge Instructions (Addendum)
Check MyChart for your lab test results Call Dr. Ashley Royalty on Monday for follow-up appointment next week Take over-the-counter pain medicine if needed

## 2023-06-14 LAB — CBC WITH DIFFERENTIAL/PLATELET
Basophils Absolute: 0 10*3/uL (ref 0.0–0.2)
Basos: 1 %
EOS (ABSOLUTE): 0 10*3/uL (ref 0.0–0.4)
Eos: 0 %
Hematocrit: 41.5 % (ref 34.0–46.6)
Hemoglobin: 13.8 g/dL (ref 11.1–15.9)
Immature Grans (Abs): 0 10*3/uL (ref 0.0–0.1)
Immature Granulocytes: 0 %
Lymphocytes Absolute: 2.1 10*3/uL (ref 0.7–3.1)
Lymphs: 31 %
MCH: 31.8 pg (ref 26.6–33.0)
MCHC: 33.3 g/dL (ref 31.5–35.7)
MCV: 96 fL (ref 79–97)
Monocytes Absolute: 0.5 10*3/uL (ref 0.1–0.9)
Monocytes: 7 %
Neutrophils Absolute: 4.1 10*3/uL (ref 1.4–7.0)
Neutrophils: 61 %
Platelets: 294 10*3/uL (ref 150–450)
RBC: 4.34 x10E6/uL (ref 3.77–5.28)
RDW: 12.2 % (ref 11.7–15.4)
WBC: 6.8 10*3/uL (ref 3.4–10.8)

## 2023-06-14 LAB — COMPREHENSIVE METABOLIC PANEL
ALT: 25 [IU]/L (ref 0–32)
AST: 24 [IU]/L (ref 0–40)
Albumin: 4.3 g/dL (ref 3.8–4.9)
Alkaline Phosphatase: 76 [IU]/L (ref 44–121)
BUN/Creatinine Ratio: 14 (ref 9–23)
BUN: 8 mg/dL (ref 6–24)
Bilirubin Total: 0.3 mg/dL (ref 0.0–1.2)
CO2: 24 mmol/L (ref 20–29)
Calcium: 9 mg/dL (ref 8.7–10.2)
Chloride: 98 mmol/L (ref 96–106)
Creatinine, Ser: 0.56 mg/dL — ABNORMAL LOW (ref 0.57–1.00)
Globulin, Total: 2.5 g/dL (ref 1.5–4.5)
Glucose: 110 mg/dL — ABNORMAL HIGH (ref 70–99)
Potassium: 4.1 mmol/L (ref 3.5–5.2)
Sodium: 138 mmol/L (ref 134–144)
Total Protein: 6.8 g/dL (ref 6.0–8.5)
eGFR: 107 mL/min/{1.73_m2} (ref >59)

## 2023-06-15 ENCOUNTER — Ambulatory Visit (INDEPENDENT_AMBULATORY_CARE_PROVIDER_SITE_OTHER): Payer: PRIVATE HEALTH INSURANCE

## 2023-06-15 ENCOUNTER — Encounter: Payer: Self-pay | Admitting: Family Medicine

## 2023-06-15 ENCOUNTER — Other Ambulatory Visit: Payer: Self-pay | Admitting: Family Medicine

## 2023-06-15 ENCOUNTER — Ambulatory Visit (INDEPENDENT_AMBULATORY_CARE_PROVIDER_SITE_OTHER): Payer: PRIVATE HEALTH INSURANCE | Admitting: Family Medicine

## 2023-06-15 VITALS — BP 122/78 | HR 85 | Ht 64.0 in | Wt 141.0 lb

## 2023-06-15 DIAGNOSIS — S301XXA Contusion of abdominal wall, initial encounter: Secondary | ICD-10-CM

## 2023-06-15 DIAGNOSIS — Z23 Encounter for immunization: Secondary | ICD-10-CM

## 2023-06-15 DIAGNOSIS — S91019A Laceration without foreign body, unspecified ankle, initial encounter: Secondary | ICD-10-CM | POA: Diagnosis not present

## 2023-06-15 MED ORDER — CEPHALEXIN 500 MG PO CAPS: 500.0000 mg | ORAL_CAPSULE | Freq: Four times a day (QID) | ORAL | 0 refills | Status: AC

## 2023-06-15 MED ORDER — VARENICLINE TARTRATE (STARTER) 0.5 MG X 11 & 1 MG X 42 PO TBPK: ORAL_TABLET | ORAL | 0 refills | Status: DC

## 2023-06-15 MED ORDER — VARENICLINE TARTRATE 1 MG PO TABS: ORAL_TABLET | ORAL | 2 refills | Status: DC

## 2023-06-15 NOTE — Progress Notes (Signed)
Dana Khan - 57 y.o. female MRN [id number]  Date of birth: July 30, 1966  Subjective Chief Complaint  Patient presents with   Bleeding/Bruising   Laceration    HPI Dana Khan is a 57 y.o. female here today for follow up from Urgent Care.   Recently had bronchitis with severe coughing.  Noted pain in her R groin with coughing.  No bulges noted but noted purple discoloration in the upper pubic/lower abdomen region.  Pain with trying to lift both legs. She has not had fever or chills.   She also has small laceration on the L ankle.  Hit her ankle on  the corner of the door. This occurred several days ago. There is some drainage from the area.  Tdap is UTD.   ROS:  A comprehensive ROS was completed and negative except as noted per HPI  No Known Allergies  Past Medical History:  Diagnosis Date   Anxiety    Asthma     Past Surgical History:  Procedure Laterality Date   ABDOMINOPLASTY     AUGMENTATION MAMMAPLASTY Bilateral    PLACEMENT OF BREAST IMPLANTS     TUBAL LIGATION      Social History   Socioeconomic History   Marital status: Married    Spouse name: Not on file   Number of children: Not on file   Years of education: Not on file   Highest education level: Not on file  Occupational History   Not on file  Tobacco Use   Smoking status: Some Days    Current packs/day: 0.40    Average packs/day: 0.4 packs/day for 13.0 years (5.2 ttl pk-yrs)    Types: Cigarettes   Smokeless tobacco: Never  Vaping Use   Vaping status: Never Used  Substance and Sexual Activity   Alcohol use: Yes    Alcohol/week: 1.0 - 2.0 standard drink of alcohol    Types: 1 - 2 Standard drinks or equivalent per week   Drug use: Never   Sexual activity: Yes  Other Topics Concern   Not on file  Social History Narrative   Not on file   Social Determinants of Health   Financial Resource Strain: Low Risk  (06/07/2019)   Received from [facility]   Overall Financial Resource Strain  (CARDIA)    Difficulty of Paying Living Expenses: Not hard at all  Food Insecurity: No Food Insecurity (06/07/2019)   Received from [facility], [facility]   Hunger Vital Sign    Worried About Running Out of Food in the Last Year: Never true    Ran Out of Food in the Last Year: Never true  Transportation Needs: No Transportation Needs (06/07/2019)   Received from [facility], [facility]   PRAPARE - Transportation    Lack of Transportation (Medical): No    Lack of Transportation (Non-Medical): No  Physical Activity: Sufficiently Active (06/07/2019)   Received from [facility], [facility]   Exercise Vital Sign    Days of Exercise per Week: 6 days    Minutes of Exercise per Session: 50 min  Stress: No Stress Concern Present (06/07/2019)   Received from [facility], [facility] Occupational Health - Occupational Stress Questionnaire    Feeling of Stress : Not at all  Social Connections: Unknown (12/13/2021)   Received from [facility], [facility]   Social Network    Social Network: Not on file    Family History  Problem Relation Age of  Onset   Heart failure Mother    Diabetes Brother    Stroke Maternal Aunt    Colon cancer Neg Hx    Colon polyps Neg Hx    Esophageal cancer Neg Hx    Rectal cancer Neg Hx    Stomach cancer Neg Hx     Health Maintenance  Topic Date Due   Hepatitis C Screening  Never done   Cervical Cancer Screening (HPV/Pap Cotest)  Never done   Zoster Vaccines- Shingrix (1 of 2) Never done   COVID-19 Vaccine (1 - 2023-24 season) 10/07/2023 (Originally 04/05/2023)   MAMMOGRAM  04/14/2025   DTaP/Tdap/Td (3 - Td or Tdap) 12/07/2026   Colonoscopy  05/03/2028   INFLUENZA VACCINE  Completed   HIV Screening  Completed   HPV VACCINES  Aged Out      ----------------------------------------------------------------------------------------------------------------------------------------------------------------------------------------------------------------- Physical Exam BP 122/78 (BP Location: Right Arm, Patient Position: Sitting, Cuff Size: Normal)   Pulse 85   Ht 5\' 4"  (1.626 m)   Wt 141 lb (64 kg)   SpO2 97%   BMI 24.20 kg/m   Physical Exam Constitutional:      Appearance: Normal appearance.  HENT:     Head: Normocephalic and atraumatic.  Eyes:     General: No scleral icterus. Cardiovascular:     Rate and Rhythm: Normal rate and regular rhythm.  Pulmonary:     Effort: Pulmonary effort is normal.     Breath sounds: Normal breath sounds.  Abdominal:     Comments: Ecchymosis along the lower abdominal wall/suprapubic area.  Slightly firm to palpation.  Mildly tender.  Musculoskeletal:     Cervical back: Neck supple.  Skin:    Comments: Small laceration of the ankle with purulent material and surrounding erythema.  Neurological:     Mental Status: She is alert.  Psychiatric:        Mood and Affect: Mood normal.        Behavior: Behavior normal.     ------------------------------------------------------------------------------------------------------------------------------------------------------------------------------------------------------------------- Assessment and Plan  Contusion of abdominal wall She has a bruising along the lower abdominal wall.  This occurred after heavy coughing.  Ultrasound ordered to evaluate for possible abdominal wall sheath hematoma.  Laceration of ankle There is no uncontrolled bleeding at this time however it does appear to be signs of infection.  Adding course of Keflex.  Precautions and red flags discussed.   Meds ordered this encounter  Medications   Varenicline Tartrate, Starter, (CHANTIX STARTING MONTH PAK) 0.5 MG X 11 & 1 MG X 42 TBPK    Sig: Take as directed on  package    Dispense:  53 each    Refill:  0   varenicline (CHANTIX CONTINUING MONTH PAK) 1 MG tablet    Sig: Start after completion of starter pack    Dispense:  60 tablet    Refill:  2   cephALEXin (KEFLEX) 500 MG capsule    Sig: Take 1 capsule (500 mg total) by mouth 4 (four) times daily for 7 days.    Dispense:  28 capsule    Refill:  0    No follow-ups on file.   This visit occurred during the SARS-CoV-2 public health emergency.  Safety protocols were in place, including screening questions prior to the visit, additional usage of staff PPE, and extensive cleaning of exam room while observing appropriate contact time as indicated for disinfecting solutions.

## 2023-06-15 NOTE — Assessment & Plan Note (Signed)
She has a bruising along the lower abdominal wall.  This occurred after heavy coughing.  Ultrasound ordered to evaluate for possible abdominal wall sheath hematoma.

## 2023-06-15 NOTE — Assessment & Plan Note (Signed)
There is no uncontrolled bleeding at this time however it does appear to be signs of infection.  Adding course of Keflex.  Precautions and red flags discussed.

## 2023-06-16 ENCOUNTER — Encounter: Payer: Self-pay | Admitting: Obstetrics and Gynecology

## 2024-01-05 DIAGNOSIS — F32A Depression, unspecified: Secondary | ICD-10-CM | POA: Insufficient documentation

## 2024-01-29 ENCOUNTER — Other Ambulatory Visit: Payer: Self-pay | Admitting: Family Medicine

## 2024-01-29 DIAGNOSIS — Z1231 Encounter for screening mammogram for malignant neoplasm of breast: Secondary | ICD-10-CM

## 2024-02-01 ENCOUNTER — Ambulatory Visit: Payer: Self-pay | Admitting: *Deleted

## 2024-02-01 NOTE — Telephone Encounter (Signed)
 FYI Only or Action Required?: FYI only for provider.  Patient was last seen in primary care on 06/15/2023 by Alvia Bring, DO. Called Nurse Triage reporting Dizziness. Symptoms began about a month ago. Interventions attempted: Nothing. Symptoms are: gradually worsening.  Triage Disposition: See PCP When Office is Open (Within 3 Days)  Patient/caregiver understands and will follow disposition?: Yes Reason for Disposition  [1] MILD dizziness (e.g., walking normally) AND [2] has NOT been evaluated by doctor (or NP/PA) for this  (Exception: Dizziness caused by heat exposure, sudden standing, or poor fluid intake.)  Answer Assessment - Initial Assessment Questions 1. DESCRIPTION: Describe your dizziness.     Lightheaded this morning- comes and goes 2. LIGHTHEADED: Do you feel lightheaded? (e.g., somewhat faint, woozy, weak upon standing)     yes 3. VERTIGO: Do you feel like either you or the room is spinning or tilting? (i.e. vertigo)     no 4. SEVERITY: How bad is it?  Do you feel like you are going to faint? Can you stand and walk?   - MILD: Feels slightly dizzy, but walking normally.   - MODERATE: Feels unsteady when walking, but not falling; interferes with normal activities (e.g., school, work).   - SEVERE: Unable to walk without falling, or requires assistance to walk without falling; feels like passing out now.      mild 5. ONSET:  When did the dizziness begin?     1 month- off/on 6. AGGRAVATING FACTORS: Does anything make it worse? (e.g., standing, change in head position)     No- usually occurs when standing 7. HEART RATE: Can you tell me your heart rate? How many beats in 15 seconds?  (Note: not all patients can do this)       Not checked 8. CAUSE: What do you think is causing the dizziness?     Not sure 9. RECURRENT SYMPTOM: Have you had dizziness before? If Yes, ask: When was the last time? What happened that time?     Yes- age 58-  evaluated but  never diagnosed 10. OTHER SYMPTOMS: Do you have any other symptoms? (e.g., fever, chest pain, vomiting, diarrhea, bleeding)       Congestion, runny nose, headache  Protocols used: Dizziness - Lightheadedness-A-AH    Copied from CRM [id number]. Topic: Clinical - Red Word Triage >> Feb 01, 2024 12:02 PM Mercer PEDLAR wrote: Red Word that prompted transfer to Nurse Triage: dizziness.

## 2024-02-03 ENCOUNTER — Encounter: Payer: Self-pay | Admitting: Family Medicine

## 2024-02-03 ENCOUNTER — Ambulatory Visit (INDEPENDENT_AMBULATORY_CARE_PROVIDER_SITE_OTHER): Payer: PRIVATE HEALTH INSURANCE | Admitting: Family Medicine

## 2024-02-03 VITALS — BP 122/76 | HR 73 | Ht 64.0 in | Wt 143.0 lb

## 2024-02-03 DIAGNOSIS — J301 Allergic rhinitis due to pollen: Secondary | ICD-10-CM

## 2024-02-03 DIAGNOSIS — J309 Allergic rhinitis, unspecified: Secondary | ICD-10-CM | POA: Insufficient documentation

## 2024-02-03 DIAGNOSIS — F17210 Nicotine dependence, cigarettes, uncomplicated: Secondary | ICD-10-CM

## 2024-02-03 MED ORDER — AZELASTINE HCL 0.1 % NA SOLN: 2.0000 | Freq: Two times a day (BID) | NASAL | 11 refills | Status: AC

## 2024-02-03 MED ORDER — VARENICLINE TARTRATE 1 MG PO TABS: ORAL_TABLET | ORAL | 2 refills | Status: AC

## 2024-02-03 MED ORDER — PREDNISONE 20 MG PO TABS
20.0000 mg | ORAL_TABLET | Freq: Two times a day (BID) | ORAL | 0 refills | Status: AC
Start: 2024-02-03 — End: 2024-02-08

## 2024-02-03 MED ORDER — VARENICLINE TARTRATE (STARTER) 0.5 MG X 11 & 1 MG X 42 PO TBPK: ORAL_TABLET | ORAL | 0 refills | Status: AC

## 2024-02-03 NOTE — Assessment & Plan Note (Signed)
 She may continue chlorpheniramine as this seems to be working well for her.  Sending in Astelin  nasal spray.  Prednisone  burst again to help with eustachian tube dysfunction.  I recommended that she stop Afrin as she has used this for 3 days at this point.

## 2024-02-03 NOTE — Assessment & Plan Note (Signed)
 She has been able to quit smoking however continues to vape some.  Chantix  renewed.

## 2024-02-03 NOTE — Progress Notes (Signed)
 Dana Khan - 58 y.o. female MRN [id number]  Date of birth: 06-Sep-1965  Subjective Chief Complaint  Patient presents with   Sinus Problem    HPI Dana Khan is a 58 y.o. female here today with complaint of sinus congestion, rhinorrhea and dizziness.  She has had rhinorrhea and congestion for several days.  She did have dizziness for a few days but this seems to be improved.  Does still feel some fullness in the ear.  She did start chlorpheniramine which seems to be helping.  She also has used Nasacort and Afrin for a few days.  She denies sinus pain, fever, chills or headaches.  She has been able to quit smoking but is using a vape.  She would like renewal of Chantix .  ROS:  A comprehensive ROS was completed and negative except as noted per HPI   No Known Allergies  Past Medical History:  Diagnosis Date   Anxiety    Asthma     Past Surgical History:  Procedure Laterality Date   ABDOMINOPLASTY     AUGMENTATION MAMMAPLASTY Bilateral    PLACEMENT OF BREAST IMPLANTS     TUBAL LIGATION      Social History   Socioeconomic History   Marital status: Married    Spouse name: Not on file   Number of children: Not on file   Years of education: Not on file   Highest education level: Not on file  Occupational History   Not on file  Tobacco Use   Smoking status: Former    Average packs/day: 0.4 packs/day for 13.0 years (5.2 ttl pk-yrs)    Types: Cigarettes    Start date: 08/06/2023    Quit date: 2012    Years since quitting: 13.5   Smokeless tobacco: Never  Vaping Use   Vaping status: Never Used  Substance and Sexual Activity   Alcohol use: Yes    Alcohol/week: 1.0 - 2.0 standard drink of alcohol    Types: 1 - 2 Standard drinks or equivalent per week   Drug use: Never   Sexual activity: Yes  Other Topics Concern   Not on file  Social History Narrative   Not on file   Social Drivers of Health   Financial Resource Strain: Low Risk  (06/07/2019)   Received from [facility]    Overall Financial Resource Strain (CARDIA)    Difficulty of Paying Living Expenses: Not hard at all  Food Insecurity: No Food Insecurity (02/03/2024)   Hunger Vital Sign    Worried About Running Out of Food in the Last Year: Never true    Ran Out of Food in the Last Year: Never true  Transportation Needs: No Transportation Needs (02/03/2024)   PRAPARE - Administrator, Civil Service (Medical): No    Lack of Transportation (Non-Medical): No  Physical Activity: Sufficiently Active (06/07/2019)   Received from [facility]   Exercise Vital Sign    On average, how many days per week do you engage in moderate to strenuous exercise (like a brisk walk)?: 6 days    On average, how many minutes do you engage in exercise at this level?: 50 min  Stress: No Stress Concern Present (06/07/2019)   Received from [facility] of Occupational Health - Occupational Stress Questionnaire    Feeling of Stress : Not at all  Social Connections: Moderately Integrated (02/03/2024)   Social Connection and Isolation Panel    Frequency of Communication with Friends and  Family: More than three times a week    Frequency of Social Gatherings with Friends and Family: Twice a week    Attends Religious Services: 1 to 4 times per year    Active Member of Clubs or Organizations: No    Attends Banker Meetings: Never    Marital Status: Married    Family History  Problem Relation Age of Onset   Heart failure Mother    Diabetes Brother    Stroke Maternal Aunt    Colon cancer Neg Hx    Colon polyps Neg Hx    Esophageal cancer Neg Hx    Rectal cancer Neg Hx    Stomach cancer Neg Hx     Health Maintenance  Topic Date Due   Hepatitis C Screening  Never done   Hepatitis B Vaccines (1 of 3 - 19+ 3-dose series) Never done   Zoster Vaccines- Shingrix (1 of 2) Never done   COVID-19 Vaccine (1 - 2024-25 season) Never done   INFLUENZA VACCINE  03/04/2024   MAMMOGRAM  04/14/2025    DTaP/Tdap/Td (3 - Td or Tdap) 12/07/2026   Cervical Cancer Screening (HPV/Pap Cotest)  03/03/2028   Colonoscopy  05/03/2028   Pneumococcal Vaccine 64-65 Years old  Completed   HIV Screening  Completed   HPV VACCINES  Aged Out   Meningococcal B Vaccine  Aged Out     ----------------------------------------------------------------------------------------------------------------------------------------------------------------------------------------------------------------- Physical Exam BP 122/76 (BP Location: Left Arm, Patient Position: Sitting, Cuff Size: Small)   Pulse 73   Ht 5' 4 (1.626 m)   Wt 143 lb (64.9 kg)   SpO2 96%   BMI 24.55 kg/m   Physical Exam Constitutional:      Appearance: Normal appearance.  HENT:     Right Ear: Tympanic membrane and ear canal normal.     Left Ear: Ear canal normal.     Ears:     Comments: Left TM with air-fluid level  Eyes:     General: No scleral icterus. Cardiovascular:     Rate and Rhythm: Normal rate and regular rhythm.  Pulmonary:     Effort: Pulmonary effort is normal.     Breath sounds: Normal breath sounds.  Neurological:     Mental Status: She is alert.  Psychiatric:        Mood and Affect: Mood normal.        Behavior: Behavior normal.     ------------------------------------------------------------------------------------------------------------------------------------------------------------------------------------------------------------------- Assessment and Plan  Nicotine  dependence She has been able to quit smoking however continues to vape some.  Chantix  renewed.  Allergic rhinitis She may continue chlorpheniramine as this seems to be working well for her.  Sending in Astelin  nasal spray.  Prednisone  burst again to help with eustachian tube dysfunction.  I recommended that she stop Afrin as she has used this for 3 days at this point.   Meds ordered this encounter  Medications   azelastine  (ASTELIN ) 0.1 %  nasal spray    Sig: Place 2 sprays into both nostrils 2 (two) times daily. Use in each nostril as directed    Dispense:  30 mL    Refill:  11    Use generic Astelin    predniSONE  (DELTASONE ) 20 MG tablet    Sig: Take 1 tablet (20 mg total) by mouth 2 (two) times daily with a meal for 5 days.    Dispense:  10 tablet    Refill:  0   varenicline  (CHANTIX  CONTINUING MONTH PAK) 1 MG tablet    Sig:  Start after completion of starter pack    Dispense:  60 tablet    Refill:  2   Varenicline  Tartrate, Starter, (CHANTIX  STARTING MONTH PAK) 0.5 MG X 11 & 1 MG X 42 TBPK    Sig: Take as directed on package    Dispense:  53 each    Refill:  0    No follow-ups on file.

## 2024-02-10 ENCOUNTER — Other Ambulatory Visit: Payer: Self-pay | Admitting: Family Medicine

## 2024-02-10 ENCOUNTER — Ambulatory Visit: Payer: Self-pay

## 2024-02-10 MED ORDER — AMOXICILLIN-POT CLAVULANATE 875-125 MG PO TABS: 1.0000 | ORAL_TABLET | Freq: Two times a day (BID) | ORAL | 0 refills | Status: AC

## 2024-02-10 NOTE — Telephone Encounter (Addendum)
 FYI Only or Action Required?: Action required by provider: update on patient condition.  Patient was last seen in primary care on 02/03/2024 by Alvia Bring, DO.  Called Nurse Triage reporting Otalgia.  Symptoms began yesterday.  Interventions attempted: Nothing.  Symptoms are: unchanged.  Triage Disposition: See Physician Within 24 Hours  Patient/caregiver understands and will follow disposition?: No, wishes to speak with PCP   Letting Dr. Alvia she developed 5/10 left ear pain that started last night. She does use q-tips after flushing, no abnormal discharge noted. She does not feel she needs to come in for an evaluation at this time and just wants to inform the doctor. No call back needed from patient end, aware she may receive anther call back if pcp has recommendation or would like a follow up scheduled. Next appt is 03/14/24 with pcp for physical.    Copied from CRM [id number]. Topic: Clinical - Medical Advice >> Feb 10, 2024  8:17 AM Merlynn A wrote: Reason for CRM: Patient called in to let Dr. Will know that she is having some ear pain. Patient was advised to call him to let him know if her ear starts hurting. Patient stated it began hurting last night. Please contact patient for assistance on ear pain. Patient can be reached at [phone].

## 2024-02-10 NOTE — Telephone Encounter (Signed)
 Reason for Disposition . Earache  (Exceptions: Brief ear pain of lasting less than 60 minutes, or earache occurring during air travel.)  Protocols used: Earache-A-AH

## 2024-02-11 NOTE — Telephone Encounter (Signed)
 Left message advising patient of the antibiotic.

## 2024-03-14 ENCOUNTER — Encounter: Payer: PRIVATE HEALTH INSURANCE | Admitting: Family Medicine

## 2024-03-14 ENCOUNTER — Other Ambulatory Visit: Payer: Self-pay | Admitting: Radiology

## 2024-03-15 ENCOUNTER — Encounter: Payer: PRIVATE HEALTH INSURANCE | Admitting: Family Medicine

## 2024-04-20 ENCOUNTER — Ambulatory Visit: Payer: PRIVATE HEALTH INSURANCE

## 2024-04-28 ENCOUNTER — Ambulatory Visit (INDEPENDENT_AMBULATORY_CARE_PROVIDER_SITE_OTHER): Payer: PRIVATE HEALTH INSURANCE

## 2024-04-28 DIAGNOSIS — Z1231 Encounter for screening mammogram for malignant neoplasm of breast: Secondary | ICD-10-CM | POA: Diagnosis not present

## 2024-07-01 ENCOUNTER — Ambulatory Visit
Admission: EM | Admit: 2024-07-01 | Discharge: 2024-07-01 | Disposition: A | Discharge status: Home / Self Care | Payer: PRIVATE HEALTH INSURANCE | Attending: Internal Medicine | Admitting: Internal Medicine

## 2024-07-01 ENCOUNTER — Ambulatory Visit: Payer: PRIVATE HEALTH INSURANCE

## 2024-07-01 ENCOUNTER — Telehealth: Payer: Self-pay | Admitting: Internal Medicine

## 2024-07-01 ENCOUNTER — Other Ambulatory Visit: Payer: Self-pay

## 2024-07-01 DIAGNOSIS — S93401A Sprain of unspecified ligament of right ankle, initial encounter: Secondary | ICD-10-CM

## 2024-07-01 DIAGNOSIS — S82851A Displaced trimalleolar fracture of right lower leg, initial encounter for closed fracture: Secondary | ICD-10-CM

## 2024-07-01 DIAGNOSIS — M25571 Pain in right ankle and joints of right foot: Secondary | ICD-10-CM

## 2024-07-01 MED ORDER — HYDROCODONE-ACETAMINOPHEN 5-325 MG PO TABS: 1.0000 | ORAL_TABLET | Freq: Four times a day (QID) | ORAL | 0 refills | Status: DC | PRN

## 2024-07-01 NOTE — ED Provider Notes (Signed)
 [facility]    CSN: [id number] Arrival date & time: 07/01/24  9160      History   Chief Complaint Chief Complaint  Patient presents with   Ankle Pain    HPI Dana Khan is a 58 y.o. female.   Patient presents with right ankle pain that started about 3 days ago after a fall.  Patient reports that she tripped over some stuff in her room.  Denies head injury or loss of consciousness.  She has taken over-the-counter pain medication with minimal improvement.  She is not reporting any numbness or tingling.   Ankle Pain   Past Medical History:  Diagnosis Date   Anxiety    Asthma     Patient Active Problem List   Diagnosis Date Noted   Allergic rhinitis 02/03/2024   Depression 01/05/2024   Contusion of abdominal wall 06/15/2023   Laceration of ankle 06/15/2023   Acute cystitis 04/21/2023   Anxiety 12/08/2022   COPD exacerbation (HCC) 05/20/2022   Acute cough 05/20/2022   Oral lesion 12/03/2021   Hyponatremia 10/17/2021   Acute pulmonary insufficiency 10/17/2021   CAP (community acquired pneumonia) 10/16/2021   Allergic conjunctivitis of both eyes 09/09/2021   Aortic atherosclerosis 05/20/2021   Well adult exam 01/29/2021   Nicotine  dependence 01/28/2021   Tobacco use disorder 09/01/2012    Past Surgical History:  Procedure Laterality Date   ABDOMINOPLASTY     AUGMENTATION MAMMAPLASTY Bilateral    PLACEMENT OF BREAST IMPLANTS     TUBAL LIGATION      OB History   No obstetric history on file.      Home Medications    Prior to Admission medications   Medication Sig Start Date End Date Taking? Authorizing Provider  HYDROcodone-acetaminophen  (NORCO/VICODIN) 5-325 MG tablet Take 1 tablet by mouth every 6 (six) hours as needed for severe pain (pain score 7-10). 07/01/24  Yes Brannon Decaire, Darryle BRAVO, FNP  albuterol  (VENTOLIN  HFA) 108 (90 Base) MCG/ACT inhaler Inhale 2 puffs into the lungs every 6 (six) hours as needed for wheezing. Patient not taking:  Reported on 02/03/2024 03/11/23   Alvia Bring, DO  amoxicillin -clavulanate (AUGMENTIN ) 875-125 MG tablet Take 1 tablet by mouth 2 (two) times daily. 02/10/24   Alvia Bring, DO  azelastine  (ASTELIN ) 0.1 % nasal spray Place 2 sprays into both nostrils 2 (two) times daily. Use in each nostril as directed 02/03/24   Alvia Bring, DO  benzonatate  (TESSALON ) 100 MG capsule Take 100 mg by mouth 3 (three) times daily as needed for cough.    [provider]  chlorpheniramine (CHLOR-TRIMETON) 4 MG tablet Take 4 mg by mouth 2 (two) times daily as needed for allergies.    [provider]  escitalopram (LEXAPRO) 20 MG tablet Take 20 mg by mouth daily. 11/25/23   [provider]  fluticasone  (FLONASE ) 50 MCG/ACT nasal spray Place 1 spray into both nostrils daily. 03/11/23   Alvia Bring, DO  Fluticasone -Umeclidin-Vilant (TRELEGY ELLIPTA ) 100-62.5-25 MCG/ACT AEPB Inhale 1 puff into the lungs daily. 03/11/23   Alvia Bring, DO  traZODone (DESYREL) 50 MG tablet Take 50 mg by mouth at bedtime as needed for sleep. 05/08/23   [provider]  varenicline  (CHANTIX  CONTINUING MONTH PAK) 1 MG tablet Start after completion of starter pack 02/03/24   Alvia Bring, DO  Varenicline  Tartrate, Starter, (CHANTIX  STARTING MONTH PAK) 0.5 MG X 11 & 1 MG X 42 TBPK Take as directed on package 02/03/24   Alvia Bring, DO  Family History Family History  Problem Relation Age of Onset   Heart failure Mother    Diabetes Brother    Stroke Maternal Aunt    Colon cancer Neg Hx    Colon polyps Neg Hx    Esophageal cancer Neg Hx    Rectal cancer Neg Hx    Stomach cancer Neg Hx     Social History Social History   Tobacco Use   Smoking status: Former    Average packs/day: 0.4 packs/day for 13.0 years (5.2 ttl pk-yrs)    Types: Cigarettes    Start date: 08/06/2023    Quit date: 2012    Years since quitting: 13.9   Smokeless tobacco: Never  Vaping Use   Vaping status: Never Used  Substance  Use Topics   Alcohol use: Not Currently    Alcohol/week: 1.0 - 2.0 standard drink of alcohol    Types: 1 - 2 Standard drinks or equivalent per week   Drug use: Never     Allergies   Patient has no known allergies.   Review of Systems Review of Systems Per HPI  Physical Exam Triage Vital Signs ED Triage Vitals  Encounter Vitals Group     BP 07/01/24 0936 134/84     Girls Systolic BP Percentile --      Girls Diastolic BP Percentile --      Boys Systolic BP Percentile --      Boys Diastolic BP Percentile --      Pulse Rate 07/01/24 0936 73     Resp 07/01/24 0936 16     Temp 07/01/24 0936 98.1 F (36.7 C)     Temp src --      SpO2 07/01/24 0936 94 %     Weight --      Height --      Head Circumference --      Peak Flow --      Pain Score 07/01/24 0939 6     Pain Loc --      Pain Education --      Exclude from Growth Chart --    No data found.  Updated Vital Signs BP 134/84   Pulse 73   Temp 98.1 F (36.7 C)   Resp 16   SpO2 94%   Visual Acuity Right Eye Distance:   Left Eye Distance:   Bilateral Distance:    Right Eye Near:   Left Eye Near:    Bilateral Near:     Physical Exam Constitutional:      General: She is not in acute distress.    Appearance: Normal appearance. She is not toxic-appearing or diaphoretic.  HENT:     Head: Normocephalic and atraumatic.  Eyes:     Extraocular Movements: Extraocular movements intact.     Conjunctiva/sclera: Conjunctivae normal.  Pulmonary:     Effort: Pulmonary effort is normal.  Musculoskeletal:     Comments: Circumferential swelling to right ankle.  Mild erythema noted to the lateral malleolus.  Tenderness to palpation throughout ankle.  Bruising discoloration noted throughout dorsal and lateral right foot but no tenderness to palpation.  Patient can wiggle toes.  Pedal pulse normal.  Capillary refill less than 3 seconds but is right at 3-second mark.  Neurological:     General: No focal deficit present.      Mental Status: She is alert and oriented to person, place, and time. Mental status is at baseline.  Psychiatric:        Mood  and Affect: Mood normal.        Behavior: Behavior normal.        Thought Content: Thought content normal.        Judgment: Judgment normal.      UC Treatments / Results  Labs (all labs ordered are listed, but only abnormal results are displayed) Labs Reviewed - No data to display  EKG   Radiology DG Ankle Complete Right Result Date: 07/01/2024 EXAM: 3 OR MORE VIEW(S) XRAY OF THE RIGHT ANKLE 07/01/2024 10:07:07 AM CLINICAL HISTORY: twisted weds twisted weds COMPARISON: None available. FINDINGS: BONES AND JOINTS: There is an obliquely oriented, mildly displaced fracture of the lateral malleolus. There are nondisplaced fractures of the medial and posterior malleoli. No joint dislocation. SOFT TISSUES: There is diffuse soft tissue swelling about the ankle. IMPRESSION: 1. Obliquely oriented, mildly displaced fracture of the lateral malleolus. 2. Nondisplaced fractures of the medial and posterior malleoli. 3. Diffuse soft tissue swelling about the ankle. Electronically signed by: Evalene Coho MD 07/01/2024 10:22 AM EST RP Workstation: [id number]   DG Foot Complete Right Result Date: 07/01/2024 EXAM: 3 or more VIEW(S) XRAY OF THE RIGHT FOOT 07/01/2024 10:07:07 AM COMPARISON: None available. CLINICAL HISTORY: twisted weds FINDINGS: BONES AND JOINTS: There is a trimalleolar fracture. No joint dislocation. The bones of the foot are unremarkable. SOFT TISSUES: The soft tissues are unremarkable. IMPRESSION: 1. Trimalleolar fracture. Electronically signed by: Evalene Coho MD 07/01/2024 10:20 AM EST RP Workstation: [id number]    Procedures Procedures (including critical care time)  Medications Ordered in UC Medications - No data to display  Initial Impression / Assessment and Plan / UC Course  I have reviewed the triage vital signs and the nursing  notes.  Pertinent labs & imaging results that were available during my care of the patient were reviewed by me and considered in my medical decision making (see chart for details).     X-ray is showing trimalleolar fracture of right ankle.  I do think patient needs to be evaluated by orthopedist today given this is a trimalleolar fracture and capillary refill is barely normal.  Pedal pulses normal which is reassuring.  Called [facility] urgent care who is open today despite holiday.  Therefore, advised patient to go to urgent care as soon as possible for evaluation by orthopedist as they have a walk-in clinic.  She was agreeable with this plan.  Clinical staff placed a cam boot and provided patient with crutches.  Advised patient of nonweightbearing until otherwise advised by orthopedist.  Also discussed RICE.  Patient requested pain medications and Norco was prescribed to take as needed.  PDMP reviewed.  Patient reports that the only medication she takes daily is Chantix  so this should be safe.  Discussed with patient that this medication contains Tylenol  and to not take any additional Tylenol .  Also advised patient that this can make her drowsy and do not drive or drink alcohol while taking this medication.  Provided patient with address for orthopedic walk-in clinic.  Her family member transported her to walk-in clinic today.  Patient verbalized understanding and was agreeable with plan. Final Clinical Impressions(s) / UC Diagnoses   Final diagnoses:  Closed trimalleolar fracture of right ankle, initial encounter     Discharge Instructions      Please go to orthopedic urgent care today for evaluation.  I have prescribed you a pain medication to take as needed but please be advised that it can make you drowsy.  Do not drive or  drink alcohol while taking this medication.  Do not bear weight until otherwise advised by orthopedist.    ED Prescriptions     Medication Sig Dispense Auth.  Provider   HYDROcodone-acetaminophen  (NORCO/VICODIN) 5-325 MG tablet Take 1 tablet by mouth every 6 (six) hours as needed for severe pain (pain score 7-10). 10 tablet [place], Darryle BRAVO, [place]      I have reviewed the PDMP during this encounter.   Hazen Darryle BRAVO, [place] 07/01/24 1118

## 2024-07-01 NOTE — ED Triage Notes (Addendum)
 Wednesday morning twisted right ankle. Has c/o foot and ankle pain. Has been unable to walk on it. Reports she fell down. Has had ibuprofen and tylenol .

## 2024-07-01 NOTE — Discharge Instructions (Addendum)
 Please go to orthopedic urgent care today for evaluation.  I have prescribed you a pain medication to take as needed but please be advised that it can make you drowsy.  Do not drive or drink alcohol while taking this medication.  Do not bear weight until otherwise advised by orthopedist.

## 2024-07-01 NOTE — Telephone Encounter (Signed)
 Patient called stating that the orthopedic urgent care she was originally sent to is claiming there is no orthopedist there today. Therefore, called [facility] walk in clinic in [place]. They state they are open today so patient was advised to go to [facility] and she was agreeable with plan.

## 2024-07-04 ENCOUNTER — Encounter: Payer: Self-pay | Admitting: Family Medicine

## 2024-07-04 ENCOUNTER — Ambulatory Visit: Payer: PRIVATE HEALTH INSURANCE | Admitting: Family Medicine

## 2024-07-04 ENCOUNTER — Ambulatory Visit: Payer: Self-pay

## 2024-07-04 VITALS — BP 138/88 | HR 80 | Ht 64.0 in | Wt 143.0 lb

## 2024-07-04 DIAGNOSIS — S82851A Displaced trimalleolar fracture of right lower leg, initial encounter for closed fracture: Secondary | ICD-10-CM | POA: Diagnosis not present

## 2024-07-04 MED ORDER — HYDROCODONE-ACETAMINOPHEN 7.5-325 MG PO TABS: 1.0000 | ORAL_TABLET | Freq: Four times a day (QID) | ORAL | 0 refills | Status: AC | PRN

## 2024-07-04 NOTE — Telephone Encounter (Signed)
 FYI Only or Action Required?: FYI only for provider: appointment scheduled on this afternoon.  Patient was last seen in primary care on 02/03/2024 by Alvia Bring, DO.  Called Nurse Triage reporting Ankle Injury ankle has 3 fractures.  Symptoms began several days ago. Weds last week  Interventions attempted: Other: seen at [facility] and ortho.  Symptoms are: unchanged.  Triage Disposition: See HCP Within 4 Hours (Or PCP Triage)  Patient/caregiver understands and will follow disposition?: Yes                     Copied from CRM #[id number]. Topic: Clinical - Red Word Triage >> Jul 04, 2024  8:14 AM Avram MATSU wrote: Red Word that prompted transfer to Nurse Triage: pain on ankle and bruised    ----------------------------------------------------------------------- From previous Reason for Contact - Scheduling: Patient/patient representative is calling to schedule an appointment. Refer to attachments for appointment information. Reason for Disposition  [1] SEVERE pain AND [2] not improved 2 hours after pain medicine/ice packs  Answer Assessment - Initial Assessment Questions 1. MECHANISM: How did the injury happen? (e.g., twisting injury, direct blow)      Tripped and fell 2. ONSET: When did the injury happen? (Minutes or hours ago)      Weds 3. LOCATION: Where is the injury located?      Right ankle 4. APPEARANCE of INJURY: What does the injury look like?      Black, swollen 5. WEIGHT-BEARING: Can you put weight on that foot? Can you walk (four steps or more)?       yes 6. SIZE: For cuts, bruises, or swelling, ask: How large is it? (e.g., inches or centimeters;  entire joint)      Swollen  - discolored 7. PAIN: Is there pain? If Yes, ask: How bad is the pain?    (e.g., Scale 1-10; or mild, moderate, severe)   - NONE (0): no pain.   - MILD (1-3): doesn't interfere with normal activities.    - MODERATE (4-7): interferes with normal activities (e.g.,  work or school) or awakens from sleep, limping.    - SEVERE (8-10): excruciating pain, unable to do any normal activities, unable to walk.      moderate  Protocols used: Ankle and Foot Injury-A-AH

## 2024-07-04 NOTE — Assessment & Plan Note (Addendum)
 Continue CAM walker and crutches.  Non-weight bearing.  Ortho appt on today.  Updated rx for norco sent in.

## 2024-07-04 NOTE — Progress Notes (Signed)
 Dana Khan - 58 y.o. female MRN [id number]  Date of birth: 1966/03/19  Subjective Chief Complaint  Patient presents with   Leg Injury    HPI Dana Khan is a 58 y.o. female here today for follow up of ankle fracture.  She injured ankle after fall at home while in her bedroom.  Seen at [facility] and xrays with trimalleolar fracure of the R ankle.  Followed up with [facility] walk in clinic same day and placed in CAM walker with recommendation to see ortho this week.  She needs referral to provider in [facility] network due to her insurance.  Using Norco for pain but not sleeping well due to continued pain. She is using boot and crutches.   ROS:  A comprehensive ROS was completed and negative except as noted per HPI  No Known Allergies  Past Medical History:  Diagnosis Date   Anxiety    Asthma     Past Surgical History:  Procedure Laterality Date   ABDOMINOPLASTY     AUGMENTATION MAMMAPLASTY Bilateral    PLACEMENT OF BREAST IMPLANTS     TUBAL LIGATION      Social History   Socioeconomic History   Marital status: Married    Spouse name: Not on file   Number of children: Not on file   Years of education: Not on file   Highest education level: Not on file  Occupational History   Not on file  Tobacco Use   Smoking status: Former    Average packs/day: 0.4 packs/day for 13.0 years (5.2 ttl pk-yrs)    Types: Cigarettes    Start date: 08/06/2023    Quit date: 2012    Years since quitting: 13.9   Smokeless tobacco: Never  Vaping Use   Vaping status: Never Used  Substance and Sexual Activity   Alcohol use: Not Currently    Alcohol/week: 1.0 - 2.0 standard drink of alcohol    Types: 1 - 2 Standard drinks or equivalent per week   Drug use: Never   Sexual activity: Yes  Other Topics Concern   Not on file  Social History Narrative   Not on file   Social Drivers of Health   Financial Resource Strain: Low Risk  (06/07/2019)   Received from [facility]   Overall Financial Resource  Strain (CARDIA)    Difficulty of Paying Living Expenses: Not hard at all  Food Insecurity: No Food Insecurity (02/03/2024)   Hunger Vital Sign    Worried About Running Out of Food in the Last Year: Never true    Ran Out of Food in the Last Year: Never true  Transportation Needs: No Transportation Needs (02/03/2024)   PRAPARE - Administrator, Civil Service (Medical): No    Lack of Transportation (Non-Medical): No  Physical Activity: Sufficiently Active (06/07/2019)   Received from [facility]   Exercise Vital Sign    On average, how many days per week do you engage in moderate to strenuous exercise (like a brisk walk)?: 6 days    On average, how many minutes do you engage in exercise at this level?: 50 min  Stress: No Stress Concern Present (06/07/2019)   Received from [facility] - Occupational Stress Questionnaire    Feeling of Stress : Not at all  Social Connections: Moderately Integrated (02/03/2024)   Social Connection and Isolation Panel    Frequency of Communication with Friends and Family: More than three times a week  Frequency of Social Gatherings with Friends and Family: Twice a week    Attends Religious Services: 1 to 4 times per year    Active Member of Clubs or Organizations: No    Attends Banker Meetings: Never    Marital Status: Married    Family History  Problem Relation Age of Onset   Heart failure Mother    Diabetes Brother    Stroke Maternal Aunt    Colon cancer Neg Hx    Colon polyps Neg Hx    Esophageal cancer Neg Hx    Rectal cancer Neg Hx    Stomach cancer Neg Hx     Health Maintenance  Topic Date Due   Hepatitis C Screening  Never done   Hepatitis B Vaccines 19-59 Average Risk (1 of 3 - 19+ 3-dose series) Never done   Zoster Vaccines- Shingrix (1 of 2) Never done   Influenza Vaccine  03/04/2024   COVID-19 Vaccine (1 - 2025-26 season) Never done   Mammogram  04/28/2026    DTaP/Tdap/Td (3 - Td or Tdap) 12/07/2026   Cervical Cancer Screening (HPV/Pap Cotest)  03/03/2028   Colonoscopy  05/03/2028   Pneumococcal Vaccine: 50+ Years  Completed   HIV Screening  Completed   HPV VACCINES  Aged Out   Meningococcal B Vaccine  Aged Out     ----------------------------------------------------------------------------------------------------------------------------------------------------------------------------------------------------------------- Physical Exam BP 138/88 (BP Location: Left Arm, Patient Position: Sitting, Cuff Size: Normal)   Pulse 80   Ht 5' 4 (1.626 m)   Wt 143 lb (64.9 kg)   SpO2 97%   BMI 24.55 kg/m   Physical Exam Constitutional:      Appearance: Normal appearance.  Musculoskeletal:     Comments: Swelling and bruising around R ankle.    Neurological:     Mental Status: She is alert.     ------------------------------------------------------------------------------------------------------------------------------------------------------------------------------------------------------------------- Assessment and Plan  Closed trimalleolar fracture of right ankle Continue CAM walker and crutches.  Non-weight bearing.  Ortho appt on today.  Updated rx for norco sent in.    Meds ordered this encounter  Medications   HYDROcodone-acetaminophen  (NORCO) 7.5-325 MG tablet    Sig: Take 1 tablet by mouth every 6 (six) hours as needed for moderate pain (pain score 4-6).    Dispense:  20 tablet    Refill:  0    No follow-ups on file.

## 2024-08-24 ENCOUNTER — Encounter: Payer: Self-pay | Admitting: Physical Therapy

## 2024-08-24 ENCOUNTER — Ambulatory Visit: Payer: PRIVATE HEALTH INSURANCE | Attending: Family Medicine | Admitting: Physical Therapy

## 2024-08-24 ENCOUNTER — Other Ambulatory Visit: Payer: Self-pay

## 2024-08-24 DIAGNOSIS — R262 Difficulty in walking, not elsewhere classified: Secondary | ICD-10-CM | POA: Diagnosis not present

## 2024-08-24 DIAGNOSIS — S82851A Displaced trimalleolar fracture of right lower leg, initial encounter for closed fracture: Secondary | ICD-10-CM | POA: Diagnosis not present

## 2024-08-24 DIAGNOSIS — M25571 Pain in right ankle and joints of right foot: Secondary | ICD-10-CM | POA: Insufficient documentation

## 2024-08-24 DIAGNOSIS — X58XXXA Exposure to other specified factors, initial encounter: Secondary | ICD-10-CM | POA: Diagnosis not present

## 2024-08-24 NOTE — Therapy (Signed)
 " OUTPATIENT PHYSICAL THERAPY LOWER EXTREMITY EVALUATION   Patient Name: Dana Khan MRN: [id number] DOB:05/30/1966, 59 y.o., female Today's Date: 08/24/2024  END OF SESSION:  PT End of Session - 08/24/24 1508     Visit Number 1    Number of Visits 16    Date for Recertification  10/19/24    Authorization Type [employer]    PT Start Time 1315    PT Stop Time 1355    PT Time Calculation (min) 40 min    Activity Tolerance Patient tolerated treatment well    Behavior During Therapy WFL for tasks assessed/performed          Past Medical History:  Diagnosis Date   Anxiety    Asthma    Past Surgical History:  Procedure Laterality Date   ABDOMINOPLASTY     AUGMENTATION MAMMAPLASTY Bilateral    PLACEMENT OF BREAST IMPLANTS     TUBAL LIGATION     Patient Active Problem List   Diagnosis Date Noted   Closed trimalleolar fracture of right ankle 07/04/2024   Allergic rhinitis 02/03/2024   Depression 01/05/2024   Contusion of abdominal wall 06/15/2023   Laceration of ankle 06/15/2023   Acute cystitis 04/21/2023   Anxiety 12/08/2022   COPD exacerbation (HCC) 05/20/2022   Acute cough 05/20/2022   Oral lesion 12/03/2021   Hyponatremia 10/17/2021   Acute pulmonary insufficiency 10/17/2021   CAP (community acquired pneumonia) 10/16/2021   Allergic conjunctivitis of both eyes 09/09/2021   Aortic atherosclerosis 05/20/2021   Well adult exam 01/29/2021   Nicotine  dependence 01/28/2021   Tobacco use disorder 09/01/2012    PCP: Alvia Bring  REFERRING PROVIDER: Elsa Bruckner  REFERRING DIAG: closed trimalleolar fracture of Rt ankle  THERAPY DIAG:  Pain in right ankle and joints of right foot  Difficulty in walking, not elsewhere classified  Rationale for Evaluation and Treatment: Rehabilitation  ONSET DATE: 07/07/25  SUBJECTIVE:   SUBJECTIVE STATEMENT: Pt is s/p surgery for Rt trimalleolar fracture on 07/07/2025. She was initially NWB and was cleared for [facility]  08/19/24. She is wearing boot at all times except during sleeping and is currently ambulating with crutches. Pt does have Rt lateral ankle pain with standing and walking and occasionally at night that shoots up her foot to her knee. She states elevation and meds help with pain. She is currently out of work due to surgery  PERTINENT HISTORY: Anxiety, asthma PAIN:  Are you having pain? Yes: NPRS scale: 3/10 at rest, 7/10 with walking Pain location: Rt lateral ankle Pain description: sore, shooting Aggravating factors: walking, standing Relieving factors: elevation, meds  PRECAUTIONS: Other: WBAT  RED FLAGS: None   WEIGHT BEARING RESTRICTIONS: WBAT  FALLS:  Has patient fallen in last 6 months? Yes. 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    PATIENT SURVEYS:  LEFS = 6/80      EDEMA:  Mild edema as expected post surgery    POSTURE: Rt cam boot  PALPATION: TTP peri scar area   LOWER EXTREMITY ROM:  Active ROM Right eval Left eval  Hip flexion    Hip extension    Hip abduction  Hip adduction    Hip internal rotation    Hip external rotation    Knee flexion    Knee extension    Ankle dorsiflexion -1 10  Ankle plantarflexion 44 65  Ankle inversion 18 25  Ankle eversion 3 25   (Blank rows = not tested)  LOWER EXTREMITY MMT:  MMT Right eval Left eval  Hip flexion    Hip extension    Hip abduction    Hip adduction    Hip internal rotation    Hip external rotation    Knee flexion    Knee extension    Ankle dorsiflexion 4-   Ankle plantarflexion 3+   Ankle inversion 3+    Ankle eversion 3    (Blank rows = not tested)   GAIT: Distance walked: 100' Assistive device utilized: Crutches Level of assistance: Modified independence Comments: gait with step to pattern with crutches, decreased stance phase on Rt, increased use of UE support during wt bearing on Rt LE                                                                                                                                TREATMENT DATE: 08/24/24 See HEP Pt educated on PT POC and goals, HEP, rationale for treatment    PATIENT EDUCATION:  Education details: PT POC and goals, HEP Person educated: Patient Education method: Explanation, Demonstration, and Handouts Education comprehension: verbalized understanding and returned demonstration  HOME EXERCISE PROGRAM: Access Code: [email or web address] URL: [email or web address] Date: 08/24/2024 Prepared by: Darice Conine  Exercises - Seated Toe Towel Scrunches  - 2 x daily - 7 x weekly - 3 sets - 30 reps - Seated Ankle Circles  - 2 x daily - 7 x weekly - 3 sets - 30 reps - Side to Side Weight Shift with Counter Support  - 1 x daily - 7 x weekly - 3 sets - 10 reps - Stride Stance Weight Shift  - 1 x daily - 7 x weekly - 3 sets - 10 reps  ASSESSMENT:  CLINICAL IMPRESSION: Patient is a 59 y.o. female who was seen today for physical therapy evaluation and treatment for Rt trimalleolar fracture. She presents with impaired gait, balance, ROM, strength and activity tolerance as well as increased pain. She will benefit from skilled PT to address deficits and progress towards PLOF.   OBJECTIVE IMPAIRMENTS: decreased activity tolerance, decreased balance, decreased mobility, difficulty walking, decreased ROM, decreased strength, and pain.   ACTIVITY LIMITATIONS: sitting, standing, squatting, stairs, bathing, and locomotion level  PARTICIPATION LIMITATIONS: driving, community activity, and occupation  PERSONAL FACTORS: Profession are also  affecting patient's functional outcome.   REHAB POTENTIAL: Good  CLINICAL DECISION MAKING: Evolving/moderate complexity  EVALUATION COMPLEXITY: Moderate   GOALS: Goals reviewed with patient? Yes  SHORT TERM GOALS: Target date: 09/21/2024   Pt will be independent with initial HEP Baseline: Goal status: INITIAL  2.  Pt will improve Rt ankle DF  and PF by 10 degrees to improve gait and mobility Baseline:  Goal status: INITIAL   LONG TERM GOALS: Target date: 10/19/2024    Pt will be independent with advanced HEP Baseline:  Goal status: INITIAL  2.  Pt will perform ambulation without AD with normalized gait pattern x 1000' for community mobility Baseline: using crutches Goal status: INITIAL  3.  Pt will improve Rt ankle strength to 4+/5 to ease return to work and leisure activities Baseline:  Goal status: INITIAL  4.  Pt will improve LEFS to >= 30/80 to demo improved functional mobility Baseline:  Goal status: INITIAL 5.  Pt will tolerate standing x 30 minutes without increase in pain to progress towards return to work Baseline:  Goal status: INITIAL     PLAN:  PT FREQUENCY: 2x/week  PT DURATION: 8 weeks  PLANNED INTERVENTIONS: 97164- PT Re-evaluation, 97110-Therapeutic exercises, 97530- Therapeutic activity, [id number]- Neuromuscular re-education, 97535- Self Care, 02859- Manual therapy, [id number]- Gait training, [id number]- Aquatic Therapy, [phone]- Electrical stimulation (unattended), 97016- Vasopneumatic device, [id number]- Ultrasound, [id number]- Ionotophoresis 4mg /ml Dexamethasone, 79439 (1-2 muscles), 20561 (3+ muscles)- Dry Needling, Patient/Family education, Balance training, Stair training, Taping, Cryotherapy, and Moist heat  PLAN FOR NEXT SESSION: assess response to HEP, pre gait training, ankle ROM and strength   Brinae Woods, PT 08/24/2024, 3:09 PM  "

## 2024-08-25 ENCOUNTER — Ambulatory Visit: Payer: PRIVATE HEALTH INSURANCE | Admitting: Rehabilitative and Restorative Service Providers"

## 2024-08-25 ENCOUNTER — Encounter: Payer: Self-pay | Admitting: Rehabilitative and Restorative Service Providers"

## 2024-08-25 DIAGNOSIS — R262 Difficulty in walking, not elsewhere classified: Secondary | ICD-10-CM

## 2024-08-25 DIAGNOSIS — S82851A Displaced trimalleolar fracture of right lower leg, initial encounter for closed fracture: Secondary | ICD-10-CM | POA: Diagnosis not present

## 2024-08-25 DIAGNOSIS — M25571 Pain in right ankle and joints of right foot: Secondary | ICD-10-CM

## 2024-08-30 ENCOUNTER — Ambulatory Visit: Payer: PRIVATE HEALTH INSURANCE | Admitting: Physical Therapy

## 2024-09-01 ENCOUNTER — Ambulatory Visit: Payer: PRIVATE HEALTH INSURANCE

## 2024-09-01 DIAGNOSIS — M25571 Pain in right ankle and joints of right foot: Secondary | ICD-10-CM

## 2024-09-01 DIAGNOSIS — S82851A Displaced trimalleolar fracture of right lower leg, initial encounter for closed fracture: Secondary | ICD-10-CM | POA: Diagnosis not present

## 2024-09-01 DIAGNOSIS — R262 Difficulty in walking, not elsewhere classified: Secondary | ICD-10-CM

## 2024-09-01 NOTE — Therapy (Signed)
 " OUTPATIENT PHYSICAL THERAPY LOWER EXTREMITY TREATMENT   Patient Name: Dana Khan MRN: [id number] DOB:[birth year], 59 y.o., female Today's Date: 09/01/2024  END OF SESSION:  PT End of Session - 09/01/24 0930     Visit Number 3    Number of Visits 16    Date for Recertification  10/19/24    Authorization Type [employer]    PT Start Time 0930    PT Stop Time 1020    PT Time Calculation (min) 50 min    Activity Tolerance Patient tolerated treatment well    Behavior During Therapy WFL for tasks assessed/performed          Past Medical History:  Diagnosis Date   Anxiety    Asthma    Past Surgical History:  Procedure Laterality Date   ABDOMINOPLASTY     AUGMENTATION MAMMAPLASTY Bilateral    PLACEMENT OF BREAST IMPLANTS     TUBAL LIGATION     Patient Active Problem List   Diagnosis Date Noted   Closed trimalleolar fracture of right ankle 07/04/2024   Allergic rhinitis 02/03/2024   Depression 01/05/2024   Contusion of abdominal wall 06/15/2023   Laceration of ankle 06/15/2023   Acute cystitis 04/21/2023   Anxiety 12/08/2022   COPD exacerbation (HCC) 05/20/2022   Acute cough 05/20/2022   Oral lesion 12/03/2021   Hyponatremia 10/17/2021   Acute pulmonary insufficiency 10/17/2021   CAP (community acquired pneumonia) 10/16/2021   Allergic conjunctivitis of both eyes 09/09/2021   Aortic atherosclerosis 05/20/2021   Well adult exam 01/29/2021   Nicotine  dependence 01/28/2021   Tobacco use disorder 09/01/2012    PCP: Alvia Bring REFERRING PROVIDER: Elsa Bruckner REFERRING DIAG: closed trimalleolar fracture of Rt ankle  THERAPY DIAG:  Pain in right ankle and joints of right foot  Difficulty in walking, not elsewhere classified  Rationale for Evaluation and Treatment: Rehabilitation  ONSET DATE: 07/07/25  SUBJECTIVE:   SUBJECTIVE STATEMENT: Patient reports she was stressed out with walking on ice today with the crutches to get to car. Patient states her pain  comes and goes and swelling is better.    EVAL: Pt is s/p surgery for Rt trimalleolar fracture on 07/07/2025. She was initially NWB and was cleared for [facility] 08/19/24. She is wearing boot at all times except during sleeping and is currently ambulating with crutches. Pt does have Rt lateral ankle pain with standing and walking and occasionally at night that shoots up her foot to her knee. She states elevation and meds help with pain. She is currently out of work due to surgery  PERTINENT HISTORY: Anxiety, asthma PAIN:  Are you having pain? Yes: NPRS scale: 5/10 at rest, 7/10 with walking Pain location: Rt lateral ankle Pain description: sore, shooting Aggravating factors: walking, standing Relieving factors: elevation, meds  PRECAUTIONS: Other: WBAT  WEIGHT BEARING RESTRICTIONS: WBAT  FALLS:  Has patient fallen in last 6 months? Yes. 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  PATIENT SURVEYS:  LEFS = 6/80  EDEMA:  Mild edema as expected post surgery  POSTURE: Rt cam boot  PALPATION: TTP peri scar area  LOWER EXTREMITY ROM: Active ROM Right eval  Left eval  Hip flexion    Hip extension    Hip abduction    Hip adduction    Hip internal rotation    Hip external rotation    Knee flexion    Knee extension    Ankle dorsiflexion -1 10  Ankle plantarflexion 44 65  Ankle inversion 18 25  Ankle eversion 3 25   (Blank rows = not tested)  LOWER EXTREMITY MMT: MMT Right eval Left eval  Hip flexion    Hip extension    Hip abduction    Hip adduction    Hip internal rotation    Hip external  rotation    Knee flexion    Knee extension    Ankle dorsiflexion 4-   Ankle plantarflexion 3+   Ankle inversion 3+   Ankle eversion 3    (Blank rows = not tested)  GAIT: Distance walked: 100' Assistive device utilized: Crutches Level of assistance: Modified independence Comments: gait with step to pattern with crutches, decreased stance phase on Rt, increased use of UE support during wt bearing on Rt LE   [facility] Adult PT Treatment:                                                DATE: 09/01/2024 Neuromuscular re-ed: Seated:  Great toe extension --> 4 toes extension Toe yoga Arch lifting Supine small range straight leg Side lying hip abduction --> parallel, ER, IR x 6 reps each Therapeutic Activity: Isometrics in all planes ankle DF, PF, Inv, Ev --> 8 reps x 5 sec Seated towel scrunch --> scrunched wash cloth bundle x10 Ankle circles Modalities: Vaso (Rt) ankle, 34 degrees, light compression x 10 min (did not tolerate medium compression)   [facility] Adult PT Treatment:                                                DATE: 08/25/24 Therapeutic Exercise: Supine SLR x 12 reps R LE Sidelying Hip abduction x 12 reps R LE Manual Therapy: STM prone with scar tissue massage, gentle toe mobilization and PROM PROM ankle DF and PF, toe PROM flexion/extension Therapeutic Activity: Isometrics in all planes ankle DF, PF, Inv, Ev x 5 reps x 5 seconds Towel scrunch seated Ball rollout under R foot Toe flexion/extension with ball x 5 reps Great toe extension x 5 reps Gentle arch raising x 3 reps-- discontinued due to pain in medial aspect of foot Gait: Gait training with axillary crutches x 50 feet PT initially emphasizing step through (versus step to pattern) Then attempted 4 point gait pattern with axillary crutches Modalities: Vasopneumatic x 10 minutes low compression  TREATMENT DATE: 08/24/24 See HEP Pt educated on PT POC and goals, HEP, rationale for treatment    PATIENT EDUCATION:  Education details: Updated HEP Person educated: Patient Education method: Explanation, Demonstration, and Handouts Education comprehension: verbalized understanding and returned demonstration  HOME EXERCISE PROGRAM: Access Code: [email or web address] URL: [email or web address] Date: 09/01/2024 Prepared by: Lamarr Price  Exercises - Seated Toe Towel Scrunches  - 2 x daily - 7 x weekly - 3 sets - 30 reps - Seated Ankle Circles  - 2 x daily - 7 x weekly - 3 sets - 30 reps - Side to Side Weight Shift with Counter Support  - 1 x daily - 7 x weekly - 3 sets - 10 reps - Stride Stance Weight Shift  - 1 x daily - 7 x weekly - 3 sets - 10 reps - Toe Yoga - Alternating Great Toe and Lesser Toe Extension  - 1 x daily - 7 x weekly - 3 sets - 10 reps - Arch Lifting  - 1 x daily - 7 x weekly - 1 sets - 10 reps - 5 sec hold - Long Sitting Isometric Ankle Plantarflexion with Ball at [employer]  - 1-2 x daily - 7 x weekly - 1 sets - 10 reps - 5 sec hold - Isometric Ankle Dorsiflexion and Plantarflexion  - 1-2 x daily - 7 x weekly - 1 sets - 10 reps - 5 sec hold - Isometric Ankle Eversion at Wall  - 1-2 x daily - 7 x weekly - 1 sets - 10 reps - 5 sec hold - Isometric Ankle Inversion at Wall  - 1-2 x daily - 7 x weekly - 1 sets - 10 reps - 5 sec hold - Small Range Straight Leg Raise  - 2 x daily - 7 x weekly - 2 sets - 10 reps - 5 sec hold - Sidelying Hip Abduction  - 1-2 x daily - 7 x weekly - 1 sets - 6-10 reps --> parallel, ER, IR  ASSESSMENT:  CLINICAL IMPRESSION: Continued ankle isometric strengthening and added exercises to HEP. Continued intrinsic foot strengthening in sitting to tolerance. Noted hip weakness with side lying hip abduction variations. Patient will continue to benefit from skilled therapy to progress ankle mobility and strength within tolerance and weight bearing  precautions.   EVAL: Patient is a 60 y.o. female who was seen today for physical therapy evaluation and treatment for Rt trimalleolar fracture. She presents with impaired gait, balance, ROM, strength and activity tolerance as well as increased pain. She will benefit from skilled PT to address deficits and progress towards PLOF.   OBJECTIVE IMPAIRMENTS: decreased activity tolerance, decreased balance, decreased mobility, difficulty walking, decreased ROM, decreased strength, and pain.   GOALS: Goals reviewed with patient? Yes  SHORT TERM GOALS: Target date: 09/21/2024  Pt will be independent with initial HEP Baseline: Goal status: INITIAL  2.  Pt will improve Rt ankle DF and PF by 10 degrees to improve gait and mobility Baseline:  Goal status: INITIAL  LONG TERM GOALS: Target date: 10/19/2024  Pt will be independent with advanced HEP Baseline:  Goal status: INITIAL  2.  Pt will perform ambulation without AD with normalized gait pattern x 1000' for community mobility Baseline: using crutches Goal status: INITIAL  3.  Pt will improve Rt ankle strength to 4+/5 to ease return to work and leisure activities Baseline:  Goal status: INITIAL  4.  Pt will improve LEFS to >= 30/80 to demo improved functional mobility Baseline:  Goal status: INITIAL 5.  Pt will tolerate standing x 30 minutes without increase in pain to progress towards return to work Baseline:  Goal status: INITIAL  PLAN:  PT FREQUENCY: 2x/week  PT DURATION: 8 weeks  PLANNED INTERVENTIONS: 97164- PT Re-evaluation, 97110-Therapeutic exercises, 97530- Therapeutic activity, 97112- Neuromuscular re-education, 97535- Self Care, 02859- Manual therapy, [id number]- Gait training, [id number]- Aquatic Therapy, [phone]- Electrical stimulation (unattended), 97016- Vasopneumatic device, [id number]- Ultrasound, [id number]- Ionotophoresis 4mg /ml Dexamethasone, 79439 (1-2 muscles), 20561 (3+ muscles)- Dry Needling, Patient/Family education, Balance  training, Stair training, Taping, Cryotherapy, and Moist heat  PLAN FOR NEXT SESSION: pre gait training, ankle ROM and strength, ice/vaso if patient needs due to pain/swelling   Lamarr GORMAN Price, PTA 09/01/2024, 10:31 AM  "

## 2024-09-06 ENCOUNTER — Encounter: Payer: Self-pay | Admitting: Physical Therapy

## 2024-09-06 ENCOUNTER — Ambulatory Visit: Payer: PRIVATE HEALTH INSURANCE | Admitting: Physical Therapy

## 2024-09-06 DIAGNOSIS — M25571 Pain in right ankle and joints of right foot: Secondary | ICD-10-CM

## 2024-09-06 DIAGNOSIS — R262 Difficulty in walking, not elsewhere classified: Secondary | ICD-10-CM

## 2024-09-08 NOTE — Therapy (Unsigned)
 " OUTPATIENT PHYSICAL THERAPY LOWER EXTREMITY TREATMENT   Patient Name: Dana Khan MRN: [id number] DOB:Nov 25, 1965, 59 y.o., female Today's Date: 09/09/2024  END OF SESSION:  PT End of Session - 09/09/24 0931     Visit Number 5    Number of Visits 16    Date for Recertification  10/19/24    Authorization Type [employer]    PT Start Time 0931    PT Stop Time 1014    PT Time Calculation (min) 43 min    Activity Tolerance Patient tolerated treatment well    Behavior During Therapy [facility] for tasks assessed/performed         Past Medical History:  Diagnosis Date   Anxiety    Asthma    Past Surgical History:  Procedure Laterality Date   ABDOMINOPLASTY     AUGMENTATION MAMMAPLASTY Bilateral    PLACEMENT OF BREAST IMPLANTS     TUBAL LIGATION     Patient Active Problem List   Diagnosis Date Noted   Closed trimalleolar fracture of right ankle 07/04/2024   Allergic rhinitis 02/03/2024   Depression 01/05/2024   Contusion of abdominal wall 06/15/2023   Laceration of ankle 06/15/2023   Acute cystitis 04/21/2023   Anxiety 12/08/2022   COPD exacerbation (HCC) 05/20/2022   Acute cough 05/20/2022   Oral lesion 12/03/2021   Hyponatremia 10/17/2021   Acute pulmonary insufficiency 10/17/2021   CAP (community acquired pneumonia) 10/16/2021   Allergic conjunctivitis of both eyes 09/09/2021   Aortic atherosclerosis 05/20/2021   Well adult exam 01/29/2021   Nicotine  dependence 01/28/2021   Tobacco use disorder 09/01/2012    PCP: Alvia Bring REFERRING PROVIDER: Elsa Bruckner REFERRING DIAG: closed trimalleolar fracture of Rt ankle  THERAPY DIAG:  Pain in right ankle and joints of right foot  Difficulty in walking, not elsewhere classified  Rationale for Evaluation and Treatment: Rehabilitation  ONSET DATE: 07/07/25  SUBJECTIVE:   SUBJECTIVE STATEMENT: The patient reports increased pain around the ankle since Tuesday night. She had walked into therapy without her crutches  and thinks she may have overdone it. She reports she went back to the crutches since increased pain. Still wearing boot at all times  EVAL: Pt is s/p surgery for Rt trimalleolar fracture on 07/07/2025. She was initially [facility] and was cleared for [facility] 08/19/24. She is wearing boot at all times except during sleeping and is currently ambulating with crutches. Pt does have Rt lateral ankle pain with standing and walking and occasionally at night that shoots up her foot to her knee. She states elevation and meds help with pain. She is currently out of work due to surgery  PERTINENT HISTORY: Anxiety, asthma PAIN:  Are you having pain? Yes: NPRS scale: 6/10 on 09/09/24 Pain location: Rt lateral ankle Pain description: sore, shooting Aggravating factors: walking, standing Relieving factors: elevation, meds  PRECAUTIONS: Other: WBAT  WEIGHT BEARING RESTRICTIONS: WBAT  FALLS:  Has patient fallen in last 6 months? Yes. 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  PATIENT SURVEYS:  LEFS = 6/80  EDEMA:  Mild edema as expected post surgery  POSTURE: Rt cam boot  PALPATION: TTP peri scar area  LOWER EXTREMITY ROM: Active ROM Right eval Left eval  Hip flexion    Hip extension    Hip abduction    Hip adduction    Hip internal rotation    Hip external rotation    Knee flexion    Knee extension    Ankle dorsiflexion -1 10  Ankle plantarflexion 44 65  Ankle inversion 18 25  Ankle eversion 3 25   (Blank rows = not tested)  LOWER EXTREMITY MMT: MMT Right eval Left eval  Hip flexion    Hip extension    Hip  abduction    Hip adduction    Hip internal rotation    Hip external rotation    Knee flexion    Knee extension    Ankle dorsiflexion 4-   Ankle plantarflexion 3+   Ankle inversion 3+   Ankle eversion 3    (Blank rows = not tested)  GAIT: Distance walked: 100' Assistive device utilized: Crutches Level of assistance: Modified independence Comments: gait with step to pattern with crutches, decreased stance phase on Rt, increased use of UE support during wt bearing on Rt LE  [facility] Adult PT Treatment:                                                DATE: 09/09/24 Therapeutic Exercise: Seated Ankle DF and PF in sitting BaPS board ant/posteior and lateral Self mobilization x seated working on gentle inversion eversion with R ankle on L knee Manual Therapy: STM soleous  STM gastroc Gentle mobilization for metatarsals and tarsals working to reduce middle toe stiffness/pain Great toe flexion/extension passive AAROM toe flexion/extension for home Gait: Gait training with one axillary crutch opposite boot x 40 feet  Discussed beginning to do gait at home with one axillary crutch  Modalities: Vaso x 10 minutes R ankle    [facility] Adult PT Treatment:                                                DATE: 09/06/24 Therapeutic Exercise: Supine SLR x 15 Ankle circles Sidelying hip abd x 15 Manual Therapy: PROM Rt ankle as tolerated Therapeutic Activity: Towel scrunch seated Toe yoga Arch lifting Isometrics in all planes ankle DF, PF, Inv, Ev --> 10 reps x 5 sec Modalities: Vaso (Rt) ankle, 34 degrees, light compression x 10 min  [facility] Adult PT Treatment:                                                DATE: 09/01/2024 Neuromuscular re-ed: Seated:  Great toe extension --> 4 toes extension Toe yoga Arch lifting Supine small range straight leg Side lying hip abduction --> parallel, ER, IR x 6 reps each Therapeutic Activity: Isometrics in all planes ankle DF, PF, Inv, Ev --> 8 reps x 5  sec Seated towel scrunch --> scrunched wash cloth bundle x10 Ankle circles Modalities: Vaso (Rt) ankle, 34 degrees, light compression x 10 min (did not tolerate medium compression)   [facility] Adult PT Treatment:  DATE: 08/25/24 Therapeutic Exercise: Supine SLR x 12 reps R LE Sidelying Hip abduction x 12 reps R LE Manual Therapy: STM prone with scar tissue massage, gentle toe mobilization and PROM PROM ankle DF and PF, toe PROM flexion/extension Therapeutic Activity: Isometrics in all planes ankle DF, PF, Inv, Ev x 5 reps x 5 seconds Towel scrunch seated Ball rollout under R foot Toe flexion/extension with ball x 5 reps Great toe extension x 5 reps Gentle arch raising x 3 reps-- discontinued due to pain in medial aspect of foot Gait: Gait training with axillary crutches x 50 feet PT initially emphasizing step through (versus step to pattern) Then attempted 4 point gait pattern with axillary crutches Modalities: Vasopneumatic x 10 minutes low compression   PATIENT EDUCATION:  Education details: Updated HEP Person educated: Patient Education method: Explanation, Demonstration, and Handouts Education comprehension: verbalized understanding and returned demonstration  HOME EXERCISE PROGRAM: Access Code: [email or web address] URL: [email or web address] Date: 09/01/2024 Prepared by: Lamarr Price  Exercises - Seated Toe Towel Scrunches  - 2 x daily - 7 x weekly - 3 sets - 30 reps - Seated Ankle Circles  - 2 x daily - 7 x weekly - 3 sets - 30 reps - Side to Side Weight Shift with Counter Support  - 1 x daily - 7 x weekly - 3 sets - 10 reps - Stride Stance Weight Shift  - 1 x daily - 7 x weekly - 3 sets - 10 reps - Toe Yoga - Alternating Great Toe and Lesser Toe Extension  - 1 x daily - 7 x weekly - 3 sets - 10 reps - Arch Lifting  - 1 x daily - 7 x weekly - 1 sets - 10 reps - 5 sec hold - Long Sitting Isometric Ankle Plantarflexion  with Ball at [employer]  - 1-2 x daily - 7 x weekly - 1 sets - 10 reps - 5 sec hold - Isometric Ankle Dorsiflexion and Plantarflexion  - 1-2 x daily - 7 x weekly - 1 sets - 10 reps - 5 sec hold - Isometric Ankle Eversion at Wall  - 1-2 x daily - 7 x weekly - 1 sets - 10 reps - 5 sec hold - Isometric Ankle Inversion at Wall  - 1-2 x daily - 7 x weekly - 1 sets - 10 reps - 5 sec hold - Small Range Straight Leg Raise  - 2 x daily - 7 x weekly - 2 sets - 10 reps - 5 sec hold - Sidelying Hip Abduction  - 1-2 x daily - 7 x weekly - 1 sets - 6-10 reps --> parallel, ER, IR  ASSESSMENT:  CLINICAL IMPRESSION: The patient has increased pain today feeling like walking without crutches increased pain in the ankle. We discussed beginning with short distances in the home with 1 axillary crutch versus no crutches-- also recommended 2 crutches when out of the house due to increased pain. PT and patient discussed continuing HEP to tolerance.   EVAL: Patient is a 59 y.o. female who was seen today for physical therapy evaluation and treatment for Rt trimalleolar fracture. She presents with impaired gait, balance, ROM, strength and activity tolerance as well as increased pain. She will benefit from skilled PT to address deficits and progress towards PLOF.   OBJECTIVE IMPAIRMENTS: decreased activity tolerance, decreased balance, decreased mobility, difficulty walking, decreased ROM, decreased strength, and pain.   GOALS: Goals reviewed with patient? Yes  SHORT TERM GOALS: Target date: 09/21/2024  Pt will  be independent with initial HEP Baseline: Goal status: INITIAL  2.  Pt will improve Rt ankle DF and PF by 10 degrees to improve gait and mobility Baseline:  Goal status: INITIAL  LONG TERM GOALS: Target date: 10/19/2024  Pt will be independent with advanced HEP Baseline:  Goal status: INITIAL  2.  Pt will perform ambulation without AD with normalized gait pattern x 1000' for community mobility Baseline: using  crutches Goal status: INITIAL  3.  Pt will improve Rt ankle strength to 4+/5 to ease return to work and leisure activities Baseline:  Goal status: INITIAL  4.  Pt will improve LEFS to >= 30/80 to demo improved functional mobility Baseline:  Goal status: INITIAL 5.  Pt will tolerate standing x 30 minutes without increase in pain to progress towards return to work Baseline:  Goal status: INITIAL  PLAN:  PT FREQUENCY: 2x/week  PT DURATION: 8 weeks  PLANNED INTERVENTIONS: 97164- PT Re-evaluation, 97110-Therapeutic exercises, 97530- Therapeutic activity, 97112- Neuromuscular re-education, 97535- Self Care, [id number]- Manual therapy, [id number]- Gait training, [id number]- Aquatic Therapy, [phone]- Electrical stimulation (unattended), 97016- Vasopneumatic device, [id number]- Ultrasound, [id number]- Ionotophoresis 4mg /ml Dexamethasone, 79439 (1-2 muscles), [id number] (3+ muscles)- Dry Needling, Patient/Family education, Balance training, Stair training, Taping, Cryotherapy, and Moist heat  PLAN FOR NEXT SESSION: pre gait training, ankle ROM and strength, ice/vaso if patient needs due to pain/swelling    Andrei Mccook, PT 09/09/2024, 10:32 AM  "

## 2024-09-09 ENCOUNTER — Encounter: Payer: Self-pay | Admitting: Rehabilitative and Restorative Service Providers"

## 2024-09-09 ENCOUNTER — Ambulatory Visit: Payer: PRIVATE HEALTH INSURANCE | Admitting: Rehabilitative and Restorative Service Providers"

## 2024-09-09 DIAGNOSIS — M25571 Pain in right ankle and joints of right foot: Secondary | ICD-10-CM

## 2024-09-09 DIAGNOSIS — R262 Difficulty in walking, not elsewhere classified: Secondary | ICD-10-CM

## 2024-09-13 ENCOUNTER — Ambulatory Visit: Payer: PRIVATE HEALTH INSURANCE | Admitting: Physical Therapy

## 2024-09-15 ENCOUNTER — Ambulatory Visit: Payer: PRIVATE HEALTH INSURANCE
